# Patient Record
Sex: Female | Born: 1985 | Race: White | Hispanic: No | Marital: Married | Smoking: Current every day smoker
Health system: Southern US, Community
[De-identification: ages and names within clinical notes are randomized; demographics above are authoritative.]

## PROBLEM LIST (undated history)

## (undated) DIAGNOSIS — F419 Anxiety disorder, unspecified: Secondary | ICD-10-CM

## (undated) DIAGNOSIS — K37 Unspecified appendicitis: Secondary | ICD-10-CM

## (undated) DIAGNOSIS — Z789 Other specified health status: Secondary | ICD-10-CM

## (undated) DIAGNOSIS — K589 Irritable bowel syndrome without diarrhea: Secondary | ICD-10-CM

## (undated) HISTORY — PX: TYMPANOPLASTY: SHX33

## (undated) HISTORY — PX: FOOT SURGERY: SHX648

## (undated) HISTORY — PX: TUBAL LIGATION: SHX77

---
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
## 2003-01-07 ENCOUNTER — Inpatient Hospital Stay (HOSPITAL_COMMUNITY): Admission: AD | Admit: 2003-01-07 | Discharge: 2003-01-07 | Payer: Self-pay | Admitting: Obstetrics

## 2003-01-24 ENCOUNTER — Inpatient Hospital Stay (HOSPITAL_COMMUNITY): Admission: AD | Admit: 2003-01-24 | Discharge: 2003-01-24 | Payer: Self-pay | Admitting: Obstetrics

## 2003-01-30 ENCOUNTER — Inpatient Hospital Stay (HOSPITAL_COMMUNITY): Admission: AD | Admit: 2003-01-30 | Discharge: 2003-01-30 | Payer: Self-pay | Admitting: Obstetrics

## 2003-02-06 ENCOUNTER — Inpatient Hospital Stay (HOSPITAL_COMMUNITY): Admission: AD | Admit: 2003-02-06 | Discharge: 2003-02-09 | Payer: Self-pay | Admitting: Obstetrics

## 2004-01-25 ENCOUNTER — Emergency Department (HOSPITAL_COMMUNITY): Admission: EM | Admit: 2004-01-25 | Discharge: 2004-01-26 | Payer: Self-pay | Admitting: Emergency Medicine

## 2004-07-15 ENCOUNTER — Emergency Department (HOSPITAL_COMMUNITY): Admission: EM | Admit: 2004-07-15 | Discharge: 2004-07-15 | Payer: Self-pay | Admitting: Emergency Medicine

## 2004-08-29 ENCOUNTER — Emergency Department (HOSPITAL_COMMUNITY): Admission: EM | Admit: 2004-08-29 | Discharge: 2004-08-29 | Payer: Self-pay | Admitting: Emergency Medicine

## 2004-08-30 ENCOUNTER — Emergency Department (HOSPITAL_COMMUNITY): Admission: EM | Admit: 2004-08-30 | Discharge: 2004-08-30 | Payer: Self-pay | Admitting: Family Medicine

## 2005-01-26 ENCOUNTER — Inpatient Hospital Stay (HOSPITAL_COMMUNITY): Admission: AD | Admit: 2005-01-26 | Discharge: 2005-01-26 | Payer: Self-pay | Admitting: Obstetrics

## 2005-05-21 ENCOUNTER — Emergency Department (HOSPITAL_COMMUNITY): Admission: AD | Admit: 2005-05-21 | Discharge: 2005-05-21 | Payer: Self-pay | Admitting: Emergency Medicine

## 2005-08-20 ENCOUNTER — Emergency Department (HOSPITAL_COMMUNITY): Admission: EM | Admit: 2005-08-20 | Discharge: 2005-08-20 | Payer: Self-pay | Admitting: Family Medicine

## 2005-10-08 ENCOUNTER — Emergency Department (HOSPITAL_COMMUNITY): Admission: EM | Admit: 2005-10-08 | Discharge: 2005-10-08 | Payer: Self-pay | Admitting: Family Medicine

## 2005-10-19 ENCOUNTER — Inpatient Hospital Stay (HOSPITAL_COMMUNITY): Admission: AD | Admit: 2005-10-19 | Discharge: 2005-10-19 | Payer: Self-pay | Admitting: Obstetrics

## 2005-10-20 ENCOUNTER — Ambulatory Visit (HOSPITAL_COMMUNITY): Admission: RE | Admit: 2005-10-20 | Discharge: 2005-10-20 | Payer: Self-pay | Admitting: Obstetrics

## 2005-10-28 ENCOUNTER — Emergency Department (HOSPITAL_COMMUNITY): Admission: EM | Admit: 2005-10-28 | Discharge: 2005-10-28 | Payer: Self-pay | Admitting: Family Medicine

## 2006-01-11 ENCOUNTER — Inpatient Hospital Stay (HOSPITAL_COMMUNITY): Admission: AD | Admit: 2006-01-11 | Discharge: 2006-01-12 | Payer: Self-pay | Admitting: Obstetrics

## 2006-03-06 ENCOUNTER — Encounter (INDEPENDENT_AMBULATORY_CARE_PROVIDER_SITE_OTHER): Payer: Self-pay | Admitting: Specialist

## 2006-03-06 ENCOUNTER — Inpatient Hospital Stay (HOSPITAL_COMMUNITY): Admission: AD | Admit: 2006-03-06 | Discharge: 2006-03-10 | Payer: Self-pay | Admitting: Obstetrics

## 2006-05-20 ENCOUNTER — Ambulatory Visit (HOSPITAL_COMMUNITY): Admission: RE | Admit: 2006-05-20 | Discharge: 2006-05-20 | Payer: Self-pay | Admitting: Obstetrics

## 2006-05-20 ENCOUNTER — Encounter (INDEPENDENT_AMBULATORY_CARE_PROVIDER_SITE_OTHER): Payer: Self-pay | Admitting: Specialist

## 2007-05-04 ENCOUNTER — Emergency Department: Payer: Self-pay | Admitting: Emergency Medicine

## 2007-09-04 ENCOUNTER — Emergency Department (HOSPITAL_COMMUNITY): Admission: EM | Admit: 2007-09-04 | Discharge: 2007-09-04 | Payer: Self-pay | Admitting: Emergency Medicine

## 2007-11-17 ENCOUNTER — Emergency Department (HOSPITAL_COMMUNITY): Admission: EM | Admit: 2007-11-17 | Discharge: 2007-11-17 | Payer: Self-pay | Admitting: Emergency Medicine

## 2008-11-23 ENCOUNTER — Emergency Department (HOSPITAL_COMMUNITY): Admission: EM | Admit: 2008-11-23 | Discharge: 2008-11-23 | Payer: Self-pay | Admitting: Emergency Medicine

## 2009-02-09 ENCOUNTER — Emergency Department (HOSPITAL_COMMUNITY): Admission: EM | Admit: 2009-02-09 | Discharge: 2009-02-09 | Payer: Self-pay | Admitting: Family Medicine

## 2009-03-23 ENCOUNTER — Emergency Department (HOSPITAL_COMMUNITY): Admission: EM | Admit: 2009-03-23 | Discharge: 2009-03-23 | Payer: Self-pay | Admitting: Family Medicine

## 2009-07-24 ENCOUNTER — Emergency Department (HOSPITAL_COMMUNITY): Admission: EM | Admit: 2009-07-24 | Discharge: 2009-07-24 | Payer: Self-pay | Admitting: Family Medicine

## 2009-11-23 ENCOUNTER — Emergency Department (HOSPITAL_COMMUNITY): Admission: EM | Admit: 2009-11-23 | Discharge: 2009-11-24 | Payer: Self-pay | Admitting: Emergency Medicine

## 2010-03-10 ENCOUNTER — Emergency Department (HOSPITAL_COMMUNITY): Admission: EM | Admit: 2010-03-10 | Discharge: 2010-03-11 | Payer: Self-pay | Admitting: Emergency Medicine

## 2010-06-10 ENCOUNTER — Emergency Department (HOSPITAL_COMMUNITY): Admission: EM | Admit: 2010-06-10 | Discharge: 2010-06-11 | Payer: Self-pay | Admitting: Emergency Medicine

## 2011-01-24 ENCOUNTER — Inpatient Hospital Stay (INDEPENDENT_AMBULATORY_CARE_PROVIDER_SITE_OTHER)
Admission: RE | Admit: 2011-01-24 | Discharge: 2011-01-24 | Disposition: A | Discharge status: Home / Self Care | Payer: Self-pay | Source: Ambulatory Visit | Attending: Family Medicine | Admitting: Family Medicine

## 2011-01-24 DIAGNOSIS — N390 Urinary tract infection, site not specified: Secondary | ICD-10-CM

## 2011-01-24 DIAGNOSIS — R229 Localized swelling, mass and lump, unspecified: Secondary | ICD-10-CM

## 2011-01-24 LAB — POCT URINALYSIS DIPSTICK
Glucose, UA: NEGATIVE mg/dL
Ketones, ur: NEGATIVE mg/dL
Nitrite: POSITIVE — AB
Protein, ur: 30 mg/dL — AB
Specific Gravity, Urine: 1.015 (ref 1.005–1.030)
Urobilinogen, UA: 0.2 mg/dL (ref 0.0–1.0)

## 2011-01-26 LAB — URINE CULTURE: Colony Count: >=100000

## 2011-01-31 ENCOUNTER — Inpatient Hospital Stay (INDEPENDENT_AMBULATORY_CARE_PROVIDER_SITE_OTHER)
Admission: RE | Admit: 2011-01-31 | Discharge: 2011-01-31 | Disposition: A | Discharge status: Home / Self Care | Payer: Self-pay | Source: Ambulatory Visit | Attending: Emergency Medicine | Admitting: Emergency Medicine

## 2011-01-31 DIAGNOSIS — K047 Periapical abscess without sinus: Secondary | ICD-10-CM

## 2011-01-31 LAB — POCT PREGNANCY, URINE: Preg Test, Ur: NEGATIVE

## 2011-02-07 LAB — D-DIMER, QUANTITATIVE: D-Dimer, Quant: <0.22 ug/mL-FEU (ref 0.00–0.48)

## 2011-02-09 LAB — URINALYSIS, ROUTINE W REFLEX MICROSCOPIC
Bilirubin Urine: NEGATIVE
Glucose, UA: NEGATIVE mg/dL
Hgb urine dipstick: NEGATIVE
Ketones, ur: NEGATIVE mg/dL
Nitrite: NEGATIVE
Protein, ur: NEGATIVE mg/dL
Specific Gravity, Urine: 1.005 (ref 1.005–1.030)
Urobilinogen, UA: 0.2 mg/dL (ref 0.0–1.0)
pH: 6 (ref 5.0–8.0)

## 2011-02-09 LAB — POCT PREGNANCY, URINE: Preg Test, Ur: NEGATIVE

## 2011-02-26 LAB — POCT I-STAT, CHEM 8
BUN: <3 mg/dL — ABNORMAL LOW (ref 6–23)
Calcium, Ion: 1.18 mmol/L (ref 1.12–1.32)
Chloride: 104 mEq/L (ref 96–112)
Creatinine, Ser: 0.7 mg/dL (ref 0.4–1.2)
Glucose, Bld: 89 mg/dL (ref 70–99)
HCT: 44 % (ref 36.0–46.0)
Hemoglobin: 15 g/dL (ref 12.0–15.0)
Potassium: 3.8 mEq/L (ref 3.5–5.1)
Sodium: 139 mEq/L (ref 135–145)
TCO2: 25 mmol/L (ref 0–100)

## 2011-02-26 LAB — POCT PREGNANCY, URINE: Preg Test, Ur: NEGATIVE

## 2011-03-08 LAB — URINALYSIS, ROUTINE W REFLEX MICROSCOPIC
Bilirubin Urine: NEGATIVE
Glucose, UA: NEGATIVE mg/dL
Hgb urine dipstick: NEGATIVE
Ketones, ur: NEGATIVE mg/dL
Nitrite: NEGATIVE
Protein, ur: NEGATIVE mg/dL
Specific Gravity, Urine: 1.024 (ref 1.005–1.030)
Urobilinogen, UA: 1 mg/dL (ref 0.0–1.0)
pH: 6.5 (ref 5.0–8.0)

## 2011-03-08 LAB — POCT PREGNANCY, URINE: Preg Test, Ur: NEGATIVE

## 2011-04-09 NOTE — Discharge Summary (Signed)
   NAME:  Cindy Livingston, Cindy Livingston                     ACCOUNT NO.:  [id number]   MEDICAL RECORD NO.:  [id number]                   PATIENT TYPE:  INP   LOCATION:  9144                                 FACILITY:  [facility]   PHYSICIAN:  Kathreen Cosier, M.D.           DATE OF BIRTH:  08/31/1986   DATE OF ADMISSION:  02/06/2003  DATE OF DISCHARGE:  02/09/2003                                 DISCHARGE SUMMARY   HOSPITAL COURSE:  The patient is an 25 year old primigravida at term who had  had a lesion that appeared to herpes the week before admission.  The  cultures were not received and not available at the time the patient was  seen in labor, so it was decided she would be delivered by C section because  of possible herpes.  On admission her hemoglobin was 11.9, platelets 249.  Postoperatively, her hemoglobin was 10.7, platelets 210.  The urine was  negative.  The patient underwent primary low transverse cesarean section and  did well postoperatively.   DISPOSITION:  She was discharged home on postoperative day #3 on Tylox one  p.o. q.3-4h for pain and ferrous sulfate 325 p.o. daily.                                               Kathreen Cosier, M.D.    BAM/MEDQ  D:  02/09/2003  T:  02/09/2003  Job:  [id number]

## 2011-04-09 NOTE — H&P (Signed)
   NAME:  Cindy Livingston, Cindy Livingston                     ACCOUNT NO.:  [id number]   MEDICAL RECORD NO.:  [id number]                   PATIENT TYPE:  INP   LOCATION:  9144                                 [facility]   PHYSICIAN:  Kathreen Cosier, M.D.           DATE OF BIRTH:  06/07/1986   DATE OF ADMISSION:  02/06/2003  DATE OF DISCHARGE:                                HISTORY & PHYSICAL   HISTORY OF PRESENT ILLNESS:  The patient is an 25 year old primigravida, [facility]  February 09, 2003.  The week prior to coming in in labor she had a lesion on  her vulva which was cultured for herpes.  It had the appearance of herpes;  however, the results were not available when the patient came in on February 06, 2003 and she was 3 cm, 100%, vertex, 0 station.  It was decided she  would be delivered by C section because of the possibility of herpes.  GBS  was negative.   PHYSICAL EXAMINATION:  HEENT:  Negative.  LUNGS:  Clear.  HEART:  Regular rhythm, no murmurs or gallop.  ABDOMEN:  Term-sized uterus.  Fetal heart 140.  PELVIC:  As described above.  EXTREMITIES:  Negative.                                               Kathreen Cosier, M.D.    BAM/MEDQ  D:  02/09/2003  T:  02/09/2003  Job:  [id number]

## 2011-04-09 NOTE — Op Note (Signed)
   NAME:  Cindy Livingston, Cindy Livingston                     ACCOUNT NO.:  [id number]   MEDICAL RECORD NO.:  [id number]                   PATIENT TYPE:  INP   LOCATION:  9144                                 [facility]   PHYSICIAN:  Kathreen Cosier, M.D.           DATE OF BIRTH:  [birth year]   DATE OF PROCEDURE:  02/06/2003  DATE OF DISCHARGE:  02/09/2003                                 OPERATIVE REPORT   PREOPERATIVE DIAGNOSIS:  Possible herpes.   PROCEDURE:   SURGEON:  Kathreen Cosier, M.D.   ANESTHESIA:  Spinal.   DESCRIPTION OF PROCEDURE:  The patient was placed on the operating table in  the supine position after spinal administered.  Abdomen prepped and draped,  bladder emptied with a Foley catheter.  Transverse suprapubic incision made  and carried down to the rectus fascia.  The fascia was __________ the length  of the incision.  The recti muscles were retracted laterally.  The  peritoneum was __________.  A transverse incision was made in the visceral  peritoneum above the bladder, and the bladder mobilized inferiorly.  A  transverse lower uterine incision was made.  The patient delivered from the  LOA position of a female, Apgars 8 and 9, weighing 6 pounds 4 ounces.  The  placenta was anterior, removed manually.  Uterine cavity cleaned with dry  laps.  The uterine incision was closed in one layer with continuous suture  of #1 chromic.  The uterus was well contracted.  Tubes and ovaries normal.  Abdomen closed in layers, peritoneum continuous with 2-0 chromic, fascia  with continuous 2-0 Dexon, and the skin closed with subcuticular stitch of 3-  0 plain.  Blood loss was 300 mL.                                               Kathreen Cosier, M.D.    BAM/MEDQ  D:  02/09/2003  T:  02/09/2003  Job:  [id number]

## 2011-04-09 NOTE — Discharge Summary (Signed)
NAMELEI, DOWER           ACCOUNT NO.:  [id number]   MEDICAL RECORD NO.:  [id number]          PATIENT TYPE:  INP   LOCATION:  9148                          [facility]   PHYSICIAN:  Kathreen Cosier, M.D.DATE OF BIRTH:  [birth year]   DATE OF ADMISSION:  03/06/2006  DATE OF DISCHARGE:  03/10/2006                                 DISCHARGE SUMMARY   The patient is a 25 year old, gravida 2, para 1-0-1, who had a previous C-  section.  She was now pregnant with twins.  Her EDC was Apr 07, 2006.  She  was followed with nonstress test and biphasic profile.  She was admitted in  early labor and underwent repeat low transverse cesarean section to an angle  of the vertex.  Apgar 8 and 9, weighing 4 pounds 6 ounces; twin B weighed 3  pounds 14 ounces, Apgar 8 and 9.   On admission, her hemoglobin was 10.2.  Postoperative hemoglobin 9.6.  She  did well and was discharged home on the third postoperative day, ambulatory  and on a regular diet, to see me in six weeks.   DISCHARGE DIAGNOSIS:  Status post repeat low transverse cesarean section at  term with twin gestation.           ______________________________  Kathreen Cosier, M.D.     BAM/MEDQ  D:  03/30/2006  T:  03/31/2006  Job:  [id number]

## 2011-04-09 NOTE — Op Note (Signed)
NAMERAGHAD, LORENZ           ACCOUNT NO.:  [id number]   MEDICAL RECORD NO.:  [id number]          PATIENT TYPE:  INP   LOCATION:  9148                          [facility]   PHYSICIAN:  Kathreen Cosier, M.D.DATE OF BIRTH:  May 27, 1986   DATE OF PROCEDURE:  03/06/2006  DATE OF DISCHARGE:                                 OPERATIVE REPORT   PREOPERATIVE DIAGNOSIS:  Previous cesarean section at 25+ weeks, pregnant  with twins in early labor, desires repeat.   SURGEON:  Kathreen Cosier, M.D.   FIRST ASSISTANT:  Charles A. Clearance Coots, M.D.   ANESTHESIA:  Spinal.   PROCEDURE:  The patient is on the operating table in a supine position.  After the spinal was administered, the abdomen was prepped and draped.  The  bladder was emptied with a Foley catheter.  A transverse suprapubic incision  was made through the old scar and carried down to the rectus fascia.  The  fascia wasopen to the length of the incision.  The rectus muscles were  retracted laterally.  The peritoneum was entered longitudinally.  A  transverse incision was made in the visceral peritoneum above the bladder.  The bladder was mobilized inferior.  A transverse lower uterine incision was  made.  The fluid around twin A was clear.  It was a vertex female with  Apgars 8 and 9, being 4 pounds 6 ounces.  Twin B was also vertex and fluid  was clear and a little bit from the LOA position.  The baby was a female  weighing 3 pounds 14 ounces with Apgars of 8 and 9.  Placenta was posterior  and removed manually.  The uterine cavity was cleaned with dry laps.  The  uterine incision was closed in one layer with continuous suture of #1  chromic.  Hemostasis was satisfactory.  The bladder flap was closed with 2-0  chromic.  It was noted on placing the first suture of 2-0 to close the  bladder flap, a hematoma started developing and this was controlled by  placing two sutures of #1 chromic through the area.  There was no  further  accumulation of blood noted.  The uterus was well-contracted.  Tubes and  ovaries were normal.  The abdomen was closed at the peritoneum with  continuous 0- chromic, the fascia with continuous 2-0 Dexon.  The skin was  closed with subcuticular stitch of 4-0 Monocryl.  The patient tolerated the  procedure well.   BLOOD LOSS:  700 mL.           ______________________________  Kathreen Cosier, M.D.     BAM/MEDQ  D:  03/06/2006  T:  03/07/2006  Job:  [id number]

## 2011-04-09 NOTE — Op Note (Signed)
Cindy Livingston, Cindy Livingston           ACCOUNT NO.:  [id number]   MEDICAL RECORD NO.:  [id number]          PATIENT TYPE:  AMB   LOCATION:  SDC                           [facility]   PHYSICIAN:  Kathreen Cosier, M.D.DATE OF BIRTH:  05-11-1986   DATE OF PROCEDURE:  05/20/2006  DATE OF DISCHARGE:                                 OPERATIVE REPORT   PREOPERATIVE DIAGNOSIS:  Severe dysplasia of the cervix.   OPERATION PERFORMED:  Cold knife conization of the cervix under general  anesthesia.   SURGEON:  Kathreen Cosier, M.D.   DESCRIPTION OF PROCEDURE:  Patient in lithotomy position, perineum and  vagina prepped and draped.  Bladder emptied with straight catheter.  Uterus  normal size, negative adnexa.  Weighted speculum placed in vagina.  Cervix  grasped with tenaculum, injected with 8 mL of 1% Xylocaine.  Suture of #1  chromic was placed at the 3 o'clock and 9 o'clock position on the cervix for  hemostasis.  Cold knife cone done in the usual manner.  Sutures of 0 chromic  placed around the cervix for hemostasis.  The cavity was sounded and the  cervix noted to be open.  The patient tolerated the procedure well.  Taken  to recovery room in good condition.           ______________________________  Kathreen Cosier, M.D.     BAM/MEDQ  D:  05/20/2006  T:  05/20/2006  Job:  [id number]

## 2011-07-13 ENCOUNTER — Inpatient Hospital Stay (INDEPENDENT_AMBULATORY_CARE_PROVIDER_SITE_OTHER)
Admission: RE | Admit: 2011-07-13 | Discharge: 2011-07-13 | Disposition: A | Discharge status: Home / Self Care | Payer: Self-pay | Source: Ambulatory Visit | Attending: Family Medicine | Admitting: Family Medicine

## 2011-07-13 DIAGNOSIS — F411 Generalized anxiety disorder: Secondary | ICD-10-CM

## 2011-07-13 DIAGNOSIS — R002 Palpitations: Secondary | ICD-10-CM

## 2011-07-13 LAB — POCT I-STAT, CHEM 8
BUN: 8 mg/dL (ref 6–23)
Calcium, Ion: 1.18 mmol/L (ref 1.12–1.32)
Chloride: 104 mEq/L (ref 96–112)
Creatinine, Ser: 0.7 mg/dL (ref 0.50–1.10)
Glucose, Bld: 95 mg/dL (ref 70–99)
HCT: 43 % (ref 36.0–46.0)
Hemoglobin: 14.6 g/dL (ref 12.0–15.0)
Potassium: 3.9 mEq/L (ref 3.5–5.1)
TCO2: 22 mmol/L (ref 0–100)

## 2011-07-13 LAB — POCT PREGNANCY, URINE: Preg Test, Ur: NEGATIVE

## 2011-08-27 LAB — URINALYSIS, ROUTINE W REFLEX MICROSCOPIC
Bilirubin Urine: NEGATIVE
Glucose, UA: NEGATIVE
Ketones, ur: NEGATIVE
Leukocytes, UA: NEGATIVE
Nitrite: NEGATIVE
Protein, ur: NEGATIVE
Specific Gravity, Urine: 1.013
Urobilinogen, UA: 1
pH: 6

## 2011-08-27 LAB — POCT I-STAT CREATININE
Creatinine, Ser: 0.8
Operator id: 284141

## 2011-08-27 LAB — I-STAT 8, (EC8 V) (CONVERTED LAB)
Acid-base deficit: 4 — ABNORMAL HIGH
BUN: 6
Bicarbonate: 20.3
Chloride: 105
Glucose, Bld: 128 — ABNORMAL HIGH
HCT: 43
Hemoglobin: 14.6
Operator id: 284141
Potassium: 3.5
Sodium: 136
TCO2: 21
pCO2, Ven: 35.7 — ABNORMAL LOW
pH, Ven: 7.362 — ABNORMAL HIGH

## 2011-08-27 LAB — POCT CARDIAC MARKERS
CKMB, poc: <1 — ABNORMAL LOW
Myoglobin, poc: 44.6
Operator id: 284141
Troponin i, poc: <0.05

## 2011-08-27 LAB — URINE MICROSCOPIC-ADD ON

## 2011-08-27 LAB — POCT PREGNANCY, URINE
Operator id: 284141
Preg Test, Ur: NEGATIVE

## 2012-01-19 ENCOUNTER — Encounter (HOSPITAL_COMMUNITY): Payer: Self-pay | Admitting: *Deleted

## 2012-01-19 ENCOUNTER — Emergency Department (HOSPITAL_COMMUNITY)
Admission: EM | Admit: 2012-01-19 | Discharge: 2012-01-19 | Disposition: A | Discharge status: Home / Self Care | Payer: Self-pay | Attending: Emergency Medicine | Admitting: Emergency Medicine

## 2012-01-19 DIAGNOSIS — H9209 Otalgia, unspecified ear: Secondary | ICD-10-CM | POA: Insufficient documentation

## 2012-01-19 DIAGNOSIS — H6090 Unspecified otitis externa, unspecified ear: Secondary | ICD-10-CM

## 2012-01-19 DIAGNOSIS — H60399 Other infective otitis externa, unspecified ear: Secondary | ICD-10-CM | POA: Insufficient documentation

## 2012-01-19 MED ORDER — CIPROFLOXACIN-HYDROCORTISONE 0.2-1 % OT SUSP: 3.0000 [drp] | Freq: Two times a day (BID) | OTIC | Status: DC

## 2012-01-19 NOTE — Discharge Instructions (Signed)
Use Cipro otic drops as directed. Use warm compresses to ear. Avoid submersing head in water for two weeks. Follow up with [facility] next week for recheck of ongoing symptoms or recurrent symptoms. Return to ER for emergent changing or worsening of symptoms. Alternate between tylenol and ibuprofen for pain.   Otitis Externa Otitis externa ("swimmer's ear") is a germ (bacterial) or fungal infection of the outer ear canal (from the eardrum to the outside of the ear). Swimming in dirty water may cause swimmer's ear. It also may be caused by moisture in the ear from water remaining after swimming or bathing. Often the first signs of infection may be itching in the ear canal. This may progress to ear canal swelling, redness, and pus drainage, which may be signs of infection. HOME CARE INSTRUCTIONS   Apply the antibiotic drops to the ear canal as prescribed by your doctor.   This can be a very painful medical condition. A strong pain reliever may be prescribed.   Only take over-the-counter or prescription medicines for pain, discomfort, or fever as directed by your caregiver.   If your caregiver has given you a follow-up appointment, it is very important to keep that appointment. Not keeping the appointment could result in a chronic or permanent injury, pain, hearing loss and disability. If there is any problem keeping the appointment, you must call back to this facility for assistance.  PREVENTION   It is important to keep your ear dry. Use the corner of a towel to wick water out of the ear canal after swimming or bathing.   Avoid scratching in your ear. This can damage the ear canal or remove the protective wax lining the canal and make it easier for germs (bacteria) or a fungus to grow.   You may use ear drops made of rubbing alcohol and vinegar after swimming to prevent future "swimmer's ear" infections. Make up a small bottle of equal parts white vinegar and alcohol. Put 3 or 4 drops into each  ear after swimming.   Avoid swimming in lakes, polluted water, or poorly chlorinated pools.  SEEK MEDICAL CARE IF:   An oral temperature above 102 F (38.9 C) develops.   Your ear is still painful after 3 days and shows signs of getting worse (redness, swelling, pain, or pus).  MAKE SURE YOU:   Understand these instructions.   Will watch your condition.   Will get help right away if you are not doing well or get worse.  Document Released: 11/08/2005 Document Revised: 07/21/2011 Document Reviewed: 06/14/2008 [facility] Patient Information 2012 [place], [place].

## 2012-01-19 NOTE — ED Provider Notes (Signed)
History     CSN: [id number]  Arrival date & time 01/19/12  1316   First MD Initiated Contact with Patient 01/19/12 1508      Chief Complaint  Patient presents with  . Otalgia    (Consider location/radiation/quality/duration/timing/severity/associated sxs/prior treatment) HPI  Patient presents to emergency department complaining of a few day history of gradual onset left ear pain with a 2 day history of drainage from her left ear. Patient states she has a history of recurring outer ear infections being seen by [facility]NT multiple times during her life time due to recurrent ear infections. Patient states her last known ear infection was approximately 2 years ago and she states "it always heals up quickly after I start Cipro eardrops." Patient brings her old medication bottle of the Cipro eardrops to show provider. Patient states she's been taking over-the-counter Tylenol and ibuprofen for the pain with good relief of symptoms. Patient states the drainage from her ear is white and purulent. She denies fevers, chills, headache, sore throat, erythema or edema of outer ear, dizziness, or confusion. Patient states symptoms are similar to her history of recurrent ear infections.  History reviewed. No pertinent past medical history.  History reviewed. No pertinent past surgical history.  History reviewed. No pertinent family history.  History  Substance Use Topics  . Smoking status: Current Everyday Smoker    Types: Cigarettes  . Smokeless tobacco: Not on file  . Alcohol Use: Yes     occ    OB History    Grav Para Term Preterm Abortions TAB SAB Ect Mult Living                  Review of Systems  All other systems reviewed and are negative.    Allergies  Codeine  Home Medications   Current Outpatient Rx  Name Route Sig Dispense Refill  . CIPROFLOXACIN-HYDROCORTISONE 0.2-1 % OT SUSP Left Ear Place 3 drops into the left ear 2 (two) times daily. 10 mL 0    BP 111/74   Pulse 100  Temp(Src) 98 F (36.7 C) (Oral)  Resp 16  SpO2 99%  LMP 01/17/2012  Physical Exam  Vitals reviewed. Constitutional: She is oriented to person, place, and time. She appears well-developed and well-nourished. No distress.  HENT:  Head: Normocephalic and atraumatic.  Right Ear: External ear normal.  Left Ear: External ear normal.  Nose: Nose normal.  Mouth/Throat: No oropharyngeal exudate.       Mild erythema of posterior pharynx and tonsils no tonsillar exudate or enlargement. Patent airway. Swallowing secretions well  Erythema of out her ear canal with moderate amount of purulent drainage. TM is clear. No erythema or edema of left outer ear. No tenderness to palpation of tragus. No post auricular tenderness, redness, or edema  Normal right ear, externa and TM  Eyes: Conjunctivae are normal.  Neck: Normal range of motion. Neck supple.  Cardiovascular: Normal rate, regular rhythm and normal heart sounds.  Exam reveals no gallop and no friction rub.   No murmur heard. Pulmonary/Chest: Effort normal and breath sounds normal. No respiratory distress. She has no wheezes. She has no rales. She exhibits no tenderness.  Abdominal: Soft. She exhibits no distension and no mass. There is no tenderness. There is no rebound and no guarding.  Lymphadenopathy:    She has no cervical adenopathy.  Neurological: She is alert and oriented to person, place, and time. She has normal reflexes.  Skin: Skin is warm and dry. No  rash noted. She is not diaphoretic.  Psychiatric: She has a normal mood and affect.    ED Course  Procedures (including critical care time)  Labs Reviewed - No data to display No results found.   1. Otitis externa       MDM  Otitis externa without signs or symptoms of malignant otitis externa. Afebrile. Non toxic appearing.         [place], [place] 01/19/12 1526

## 2012-01-19 NOTE — ED Notes (Signed)
Reports having pain and drainage to left ear for several days, reports hx of frequent ear infections.

## 2012-01-20 NOTE — ED Provider Notes (Signed)
Medical screening examination/treatment/procedure(s) were performed by non-physician practitioner and as supervising physician I was immediately available for consultation/collaboration.  Jadene Stemmer, MD 01/20/12 0225 

## 2012-03-20 ENCOUNTER — Encounter (HOSPITAL_COMMUNITY): Payer: Self-pay | Admitting: Anesthesiology

## 2012-03-20 ENCOUNTER — Encounter (HOSPITAL_COMMUNITY)
Admission: EM | Disposition: A | Discharge status: Home / Self Care | Payer: Self-pay | Source: Home / Self Care | Attending: Emergency Medicine

## 2012-03-20 ENCOUNTER — Emergency Department (HOSPITAL_COMMUNITY): Payer: Self-pay | Admitting: Anesthesiology

## 2012-03-20 ENCOUNTER — Emergency Department (HOSPITAL_COMMUNITY): Payer: Self-pay

## 2012-03-20 ENCOUNTER — Observation Stay (HOSPITAL_COMMUNITY)
Admission: EM | Admit: 2012-03-20 | Discharge: 2012-03-22 | Disposition: A | Discharge status: Home / Self Care | Payer: Self-pay | Attending: General Surgery | Admitting: General Surgery

## 2012-03-20 ENCOUNTER — Encounter (HOSPITAL_COMMUNITY): Payer: Self-pay | Admitting: Emergency Medicine

## 2012-03-20 DIAGNOSIS — K3580 Unspecified acute appendicitis: Principal | ICD-10-CM | POA: Insufficient documentation

## 2012-03-20 DIAGNOSIS — K389 Disease of appendix, unspecified: Secondary | ICD-10-CM

## 2012-03-20 HISTORY — PX: LAPAROSCOPIC APPENDECTOMY: SHX408

## 2012-03-20 HISTORY — DX: Unspecified appendicitis: K37

## 2012-03-20 HISTORY — DX: Other specified health status: Z78.9

## 2012-03-20 HISTORY — PX: APPENDECTOMY: SHX54

## 2012-03-20 LAB — CBC
HCT: 42.3 % (ref 36.0–46.0)
Hemoglobin: 14.1 g/dL (ref 12.0–15.0)
MCH: 26.3 pg (ref 26.0–34.0)
MCHC: 33.3 g/dL (ref 30.0–36.0)

## 2012-03-20 LAB — URINALYSIS, ROUTINE W REFLEX MICROSCOPIC
Nitrite: NEGATIVE
Specific Gravity, Urine: 1.005 (ref 1.005–1.030)
Urobilinogen, UA: 1 mg/dL (ref 0.0–1.0)

## 2012-03-20 LAB — COMPREHENSIVE METABOLIC PANEL
BUN: 5 mg/dL — ABNORMAL LOW (ref 6–23)
Calcium: 9.1 mg/dL (ref 8.4–10.5)
GFR calc Af Amer: >90 mL/min (ref >90)
Glucose, Bld: 103 mg/dL — ABNORMAL HIGH (ref 70–99)
Sodium: 136 mEq/L (ref 135–145)
Total Protein: 7.2 g/dL (ref 6.0–8.3)

## 2012-03-20 LAB — PREGNANCY, URINE: Preg Test, Ur: NEGATIVE

## 2012-03-20 LAB — LIPASE, BLOOD: Lipase: 23 U/L (ref 11–59)

## 2012-03-20 SURGERY — APPENDECTOMY, LAPAROSCOPIC
Anesthesia: General | Wound class: Clean

## 2012-03-20 MED ORDER — IOHEXOL 300 MG/ML  SOLN
20.0000 mL | INTRAMUSCULAR | Status: DC
  Administered 2012-03-20: 20 mL via ORAL

## 2012-03-20 MED ORDER — LACTATED RINGERS IV SOLN
INTRAVENOUS | Status: DC | PRN
  Administered 2012-03-20 (×2): via INTRAVENOUS

## 2012-03-20 MED ORDER — FENTANYL CITRATE 0.05 MG/ML IJ SOLN
INTRAMUSCULAR | Status: DC | PRN
  Administered 2012-03-20: 150 ug via INTRAVENOUS

## 2012-03-20 MED ORDER — LIDOCAINE-EPINEPHRINE 1 %-1:100000 IJ SOLN
INTRAMUSCULAR | Status: DC | PRN
  Administered 2012-03-20: 20 mL

## 2012-03-20 MED ORDER — ONDANSETRON HCL 4 MG/2ML IJ SOLN
INTRAMUSCULAR | Status: DC | PRN
  Administered 2012-03-20: 4 mg via INTRAVENOUS

## 2012-03-20 MED ORDER — NEOSTIGMINE METHYLSULFATE 1 MG/ML IJ SOLN
INTRAMUSCULAR | Status: DC | PRN
  Administered 2012-03-20: 3 mg via INTRAVENOUS

## 2012-03-20 MED ORDER — ENOXAPARIN SODIUM 40 MG/0.4ML ~~LOC~~ SOLN
40.0000 mg | SUBCUTANEOUS | Status: DC
  Administered 2012-03-21 – 2012-03-22 (×2): 40 mg via SUBCUTANEOUS
  Filled 2012-03-20 (×4): qty 0.4

## 2012-03-20 MED ORDER — ONDANSETRON HCL 4 MG/2ML IJ SOLN
4.0000 mg | Freq: Once | INTRAMUSCULAR | Status: AC
  Administered 2012-03-20: 4 mg via INTRAVENOUS
  Filled 2012-03-20: qty 2

## 2012-03-20 MED ORDER — SODIUM CHLORIDE 0.9 % IV SOLN
1.0000 g | Freq: Once | INTRAVENOUS | Status: AC
  Administered 2012-03-20: 1 g via INTRAVENOUS
  Filled 2012-03-20: qty 1

## 2012-03-20 MED ORDER — ROCURONIUM BROMIDE 100 MG/10ML IV SOLN
INTRAVENOUS | Status: DC | PRN
  Administered 2012-03-20: 30 mg via INTRAVENOUS

## 2012-03-20 MED ORDER — ONDANSETRON HCL 4 MG/2ML IJ SOLN
4.0000 mg | Freq: Four times a day (QID) | INTRAMUSCULAR | Status: DC | PRN
  Administered 2012-03-20 – 2012-03-22 (×2): 4 mg via INTRAVENOUS
  Filled 2012-03-20 (×2): qty 2

## 2012-03-20 MED ORDER — SUCCINYLCHOLINE CHLORIDE 20 MG/ML IJ SOLN
INTRAMUSCULAR | Status: DC | PRN
  Administered 2012-03-20: 100 mg via INTRAVENOUS

## 2012-03-20 MED ORDER — ONDANSETRON HCL 4 MG/2ML IJ SOLN
INTRAMUSCULAR | Status: AC
  Administered 2012-03-20: 4 mg via INTRAVENOUS
  Filled 2012-03-20: qty 2

## 2012-03-20 MED ORDER — FENTANYL CITRATE 0.05 MG/ML IJ SOLN
25.0000 ug | INTRAMUSCULAR | Status: DC | PRN
  Administered 2012-03-20 (×2): 25 ug via INTRAVENOUS
  Filled 2012-03-20 (×2): qty 2

## 2012-03-20 MED ORDER — BUPIVACAINE HCL (PF) 0.25 % IJ SOLN
INTRAMUSCULAR | Status: DC | PRN
  Administered 2012-03-20: 20 mL

## 2012-03-20 MED ORDER — KCL IN DEXTROSE-NACL 20-5-0.45 MEQ/L-%-% IV SOLN
INTRAVENOUS | Status: DC
Start: 2012-03-20 — End: 2012-03-22
  Administered 2012-03-20 – 2012-03-21 (×2): via INTRAVENOUS
  Filled 2012-03-20 (×8): qty 1000

## 2012-03-20 MED ORDER — HYDROMORPHONE HCL PF 1 MG/ML IJ SOLN
INTRAMUSCULAR | Status: AC
  Filled 2012-03-20: qty 1

## 2012-03-20 MED ORDER — SODIUM CHLORIDE 0.9 % IR SOLN
Status: DC | PRN
  Administered 2012-03-20: 1

## 2012-03-20 MED ORDER — ONDANSETRON HCL 4 MG/2ML IJ SOLN
INTRAMUSCULAR | Status: AC
  Filled 2012-03-20: qty 2

## 2012-03-20 MED ORDER — PROMETHAZINE HCL 25 MG/ML IJ SOLN: 12.5000 mg | Freq: Four times a day (QID) | INTRAMUSCULAR | Status: DC | PRN

## 2012-03-20 MED ORDER — ONDANSETRON HCL 4 MG/2ML IJ SOLN
4.0000 mg | Freq: Once | INTRAMUSCULAR | Status: AC | PRN
  Administered 2012-03-20: 4 mg via INTRAVENOUS

## 2012-03-20 MED ORDER — PROPOFOL 10 MG/ML IV EMUL
INTRAVENOUS | Status: DC | PRN
  Administered 2012-03-20: 180 mg via INTRAVENOUS

## 2012-03-20 MED ORDER — SODIUM CHLORIDE 0.9 % IV SOLN
INTRAVENOUS | Status: DC
  Administered 2012-03-20: 03:00:00 via INTRAVENOUS

## 2012-03-20 MED ORDER — LACTATED RINGERS IV SOLN
INTRAVENOUS | Status: DC
  Administered 2012-03-20: 09:00:00 via INTRAVENOUS

## 2012-03-20 MED ORDER — SODIUM CHLORIDE 0.9 % IR SOLN
Status: DC | PRN
  Administered 2012-03-20: 1000 mL

## 2012-03-20 MED ORDER — HYDROMORPHONE HCL PF 1 MG/ML IJ SOLN
1.0000 mg | Freq: Once | INTRAMUSCULAR | Status: AC
  Administered 2012-03-20: 1 mg via INTRAVENOUS
  Filled 2012-03-20: qty 1

## 2012-03-20 MED ORDER — HYDROMORPHONE HCL PF 1 MG/ML IJ SOLN
0.2500 mg | INTRAMUSCULAR | Status: DC | PRN
  Administered 2012-03-20 (×2): 0.5 mg via INTRAVENOUS

## 2012-03-20 MED ORDER — GLYCOPYRROLATE 0.2 MG/ML IJ SOLN
INTRAMUSCULAR | Status: DC | PRN
  Administered 2012-03-20: .6 mg via INTRAVENOUS

## 2012-03-20 MED ORDER — MIDAZOLAM HCL 5 MG/5ML IJ SOLN
INTRAMUSCULAR | Status: DC | PRN
  Administered 2012-03-20: 2 mg via INTRAVENOUS

## 2012-03-20 MED ORDER — KETOROLAC TROMETHAMINE 30 MG/ML IJ SOLN
30.0000 mg | Freq: Four times a day (QID) | INTRAMUSCULAR | Status: DC | PRN
  Administered 2012-03-20 – 2012-03-21 (×2): 30 mg via INTRAVENOUS
  Filled 2012-03-20: qty 1

## 2012-03-20 MED ORDER — HYDROMORPHONE HCL 2 MG PO TABS
2.0000 mg | ORAL_TABLET | ORAL | Status: DC | PRN
  Administered 2012-03-20 – 2012-03-21 (×5): 2 mg via ORAL
  Filled 2012-03-20 (×6): qty 1

## 2012-03-20 MED ORDER — KETOROLAC TROMETHAMINE 30 MG/ML IJ SOLN
INTRAMUSCULAR | Status: AC
  Filled 2012-03-20: qty 1

## 2012-03-20 MED ORDER — IOHEXOL 300 MG/ML  SOLN
100.0000 mL | Freq: Once | INTRAMUSCULAR | Status: AC | PRN
  Administered 2012-03-20: 100 mL via INTRAVENOUS

## 2012-03-20 MED ORDER — MORPHINE SULFATE 2 MG/ML IJ SOLN
2.0000 mg | INTRAMUSCULAR | Status: DC | PRN
  Administered 2012-03-20 – 2012-03-22 (×2): 2 mg via INTRAVENOUS
  Filled 2012-03-20 (×2): qty 1

## 2012-03-20 SURGICAL SUPPLY — 47 items
APPLIER CLIP ROT 10 11.4 M/L (STAPLE)
CANISTER SUCTION 2500CC (MISCELLANEOUS) IMPLANT
CHLORAPREP W/TINT 26ML (MISCELLANEOUS) ×4 IMPLANT
CLIP APPLIE ROT 10 11.4 M/L (STAPLE) IMPLANT
CLOTH BEACON ORANGE TIMEOUT ST (SAFETY) ×2 IMPLANT
COVER SURGICAL LIGHT HANDLE (MISCELLANEOUS) ×2 IMPLANT
CUTTER FLEX LINEAR 45M (STAPLE) ×2 IMPLANT
DECANTER SPIKE VIAL GLASS SM (MISCELLANEOUS) IMPLANT
DERMABOND ADVANCED (GAUZE/BANDAGES/DRESSINGS) ×3
DERMABOND ADVANCED .7 DNX12 (GAUZE/BANDAGES/DRESSINGS) ×3 IMPLANT
ELECT CAUTERY BLADE 6.4 (BLADE) ×2 IMPLANT
ELECT REM PT RETURN 9FT ADLT (ELECTROSURGICAL) ×2
ELECTRODE REM PT RTRN 9FT ADLT (ELECTROSURGICAL) ×1 IMPLANT
GLOVE BIO SURGEON STRL SZ 6.5 (GLOVE) ×4 IMPLANT
GLOVE BIOGEL PI IND STRL 7.0 (GLOVE) ×1 IMPLANT
GLOVE BIOGEL PI IND STRL 7.5 (GLOVE) ×1 IMPLANT
GLOVE BIOGEL PI INDICATOR 7.0 (GLOVE) ×1
GLOVE BIOGEL PI INDICATOR 7.5 (GLOVE) ×1
GLOVE SURG SS PI 7.5 STRL IVOR (GLOVE) ×6 IMPLANT
GOWN PREVENTION PLUS XLARGE (GOWN DISPOSABLE) ×2 IMPLANT
GOWN STRL NON-REIN LRG LVL3 (GOWN DISPOSABLE) ×4 IMPLANT
HANDLE UNIV ENDO GIA (ENDOMECHANICALS) ×4 IMPLANT
KIT BASIN OR (CUSTOM PROCEDURE TRAY) ×2 IMPLANT
KIT ROOM TURNOVER OR (KITS) ×2 IMPLANT
NS IRRIG 1000ML POUR BTL (IV SOLUTION) ×2 IMPLANT
PAD ARMBOARD 7.5X6 YLW CONV (MISCELLANEOUS) ×4 IMPLANT
PENCIL BUTTON HOLSTER BLD 10FT (ELECTRODE) ×2 IMPLANT
POUCH SPECIMEN RETRIEVAL 10MM (ENDOMECHANICALS) ×2 IMPLANT
RELOAD 45 VASCULAR/THIN (ENDOMECHANICALS) IMPLANT
RELOAD EGIA 45 MED/THCK PURPLE (STAPLE) ×4 IMPLANT
RELOAD EGIA 45 TAN VASC (STAPLE) ×2 IMPLANT
RELOAD STAPLE TA45 3.5 REG BLU (ENDOMECHANICALS) IMPLANT
SCALPEL HARMONIC ACE (MISCELLANEOUS) ×2 IMPLANT
SCISSORS LAP 5X35 DISP (ENDOMECHANICALS) IMPLANT
SET IRRIG TUBING LAPAROSCOPIC (IRRIGATION / IRRIGATOR) ×2 IMPLANT
SLEEVE ENDOPATH XCEL 5M (ENDOMECHANICALS) ×2 IMPLANT
SPECIMEN JAR SMALL (MISCELLANEOUS) ×2 IMPLANT
SUT MNCRL AB 4-0 PS2 18 (SUTURE) ×4 IMPLANT
SUT VICRYL 0 UR6 27IN ABS (SUTURE) ×4 IMPLANT
TOWEL OR 17X24 6PK STRL BLUE (TOWEL DISPOSABLE) ×2 IMPLANT
TOWEL OR 17X26 10 PK STRL BLUE (TOWEL DISPOSABLE) ×2 IMPLANT
TRAY FOLEY CATH 14FR (SET/KITS/TRAYS/PACK) ×2 IMPLANT
TRAY LAPAROSCOPIC (CUSTOM PROCEDURE TRAY) ×2 IMPLANT
TROCAR BALLN 12MMX100 BLUNT (TROCAR) ×2 IMPLANT
TROCAR XCEL BLUNT TIP 100MML (ENDOMECHANICALS) IMPLANT
TROCAR XCEL NON-BLD 5MMX100MML (ENDOMECHANICALS) ×2 IMPLANT
WATER STERILE IRR 1000ML POUR (IV SOLUTION) IMPLANT

## 2012-03-20 NOTE — Anesthesia Procedure Notes (Signed)
Procedure Name: Intubation Date/Time: 03/20/2012 9:44 AM Performed by: Gwenyth Allegra Pre-anesthesia Checklist: Timeout performed, Patient identified, Emergency Drugs available and Patient being monitored Patient Re-evaluated:Patient Re-evaluated prior to inductionOxygen Delivery Method: Circle system utilized Intubation Type: IV induction, Rapid sequence and Cricoid Pressure applied Laryngoscope Size: Mac and 3 Grade View: Grade I Tube type: Oral Tube size: 7.0 mm Airway Equipment and Method: Stylet Placement Confirmation: ETT inserted through vocal cords under direct vision,  breath sounds checked- equal and bilateral and positive ETCO2 Secured at: 21 cm Tube secured with: Tape Dental Injury: Teeth and Oropharynx as per pre-operative assessment

## 2012-03-20 NOTE — Brief Op Note (Signed)
03/20/2012  11:09 AM  PATIENT:  Cindy Livingston  26 y.o. female  PRE-OPERATIVE DIAGNOSIS:  Appendicitis  POST-OPERATIVE DIAGNOSIS:  appendicitis  PROCEDURE:  Procedure(s) (LRB): APPENDECTOMY LAPAROSCOPIC (N/A)  SURGEON:  Surgeon(s) and Role:    * [name], DO - Primary  PHYSICIAN ASSISTANT:   ASSISTANTS: none   ANESTHESIA:   general  EBL:  Total I/O In: 1000 [I.V.:1000] Out: 175 [Urine:175]  BLOOD ADMINISTERED:none  DRAINS: none   LOCAL MEDICATIONS USED:  MARCAINE    and LIDOCAINE   SPECIMEN:  Source of Specimen:  appendix  DISPOSITION OF SPECIMEN:  PATHOLOGY  COUNTS:  YES  TOURNIQUET:  * No tourniquets in log *  DICTATION: .Other Dictation: Dictation Number [id number]  PLAN OF CARE: Admit for overnight observation  PATIENT DISPOSITION:  PACU - hemodynamically stable.   Delay start of Pharmacological VTE agent (>24hrs) due to surgical blood loss or risk of bleeding: no

## 2012-03-20 NOTE — ED Notes (Addendum)
Patient is resting comfortably. States no needs at this time.  

## 2012-03-20 NOTE — Progress Notes (Signed)
UR complete 

## 2012-03-20 NOTE — ED Notes (Addendum)
Pt reports abd pain, onset yesterday around1400, comes and goes, started mid upper abd around xyphoid process area, now pain more RLQ, also some nausea, (denies: bleeding, vd, fever or rectal, vaginal or urinary sx). Last ate around 1500 (3 hamburger patties), states, "seemed to be worse after eating", last BM 1700 (2 small, 2nd loose). No meds PTA, no h/o abd surgeries. Does now mention: some urinary frequency, "more than usual".

## 2012-03-20 NOTE — ED Notes (Signed)
Surgeon at bedside.  

## 2012-03-20 NOTE — ED Provider Notes (Signed)
History     CSN: [id number]  Arrival date & time 03/20/12  0201   First MD Initiated Contact with Patient 03/20/12 0216      Chief Complaint  Patient presents with  . Abdominal Pain    (Consider location/radiation/quality/duration/timing/severity/associated sxs/prior treatment) HPI Right lower quadrant abdominal pain. Started yesterday with mid abdominal pain and now has migrated to right lower quadrant. Sharp in quality has been constant and waxing and waning without radiation. Moderate in severity. No history of similar symptoms. Has had some nausea but no vomiting or diarrhea. No vaginal bleeding or discharge. No fevers. No recent travel or medications.   History reviewed. No pertinent past medical history.  History reviewed. No pertinent past surgical history.  No family history on file.  History  Substance Use Topics  . Smoking status: Current Everyday Smoker    Types: Cigarettes  . Smokeless tobacco: Not on file  . Alcohol Use: Yes     occ    OB History    Grav Para Term Preterm Abortions TAB SAB Ect Mult Living                  Review of Systems  Constitutional: Negative for fever and chills.  HENT: Negative for neck pain and neck stiffness.   Eyes: Negative for pain.  Respiratory: Negative for shortness of breath.   Cardiovascular: Negative for chest pain.  Gastrointestinal: Positive for nausea and abdominal pain.  Genitourinary: Negative for dysuria.  Musculoskeletal: Negative for back pain.  Skin: Negative for rash.  Neurological: Negative for headaches.  All other systems reviewed and are negative.    Allergies  Codeine  Home Medications  No current outpatient prescriptions on file.  BP 122/75  Pulse 122  Temp(Src) 98.2 F (36.8 C) (Oral)  Resp 17  SpO2 99%  LMP 03/14/2012  Physical Exam  Constitutional: She is oriented to person, place, and time. She appears well-developed and well-nourished.  HENT:  Head: Normocephalic and  atraumatic.  Eyes: Conjunctivae and EOM are normal. Pupils are equal, round, and reactive to light.  Neck: Trachea normal. Neck supple. No thyromegaly present.  Cardiovascular: Normal rate, regular rhythm, S1 normal, S2 normal and normal pulses.     No systolic murmur is present   No diastolic murmur is present  Pulses:      Radial pulses are 2+ on the right side, and 2+ on the left side.  Pulmonary/Chest: Effort normal and breath sounds normal. She has no wheezes. She has no rhonchi. She has no rales. She exhibits no tenderness.  Abdominal: Soft. Normal appearance and bowel sounds are normal. There is no CVA tenderness and negative Murphy's sign.       Tender right lower quadrant over McBurney's point  Musculoskeletal:       Moves all extremities x4 without deficits  Neurological: She is alert and oriented to person, place, and time. She has normal strength. No cranial nerve deficit or sensory deficit. GCS eye subscore is 4. GCS verbal subscore is 5. GCS motor subscore is 6.  Skin: Skin is warm and dry. No rash noted. She is not diaphoretic.  Psychiatric: Her speech is normal.       Cooperative and appropriate    ED Course  Procedures (including critical care time)  Results for orders placed during the hospital encounter of 03/20/12  CBC      Component Value Range   WBC 17.2 (*) 4.0 - 10.5 (K/uL)   RBC 5.37 (*) 3.87 -  5.11 (MIL/uL)   Hemoglobin 14.1  12.0 - 15.0 (g/dL)   HCT 52.8  41.3 - 24.4 (%)   MCV 78.8  78.0 - 100.0 (fL)   MCH 26.3  26.0 - 34.0 (pg)   MCHC 33.3  30.0 - 36.0 (g/dL)   RDW 01.0  27.2 - 53.6 (%)   Platelets 291  150 - 400 (K/uL)  COMPREHENSIVE METABOLIC PANEL      Component Value Range   Sodium 136  135 - 145 (mEq/L)   Potassium 3.7  3.5 - 5.1 (mEq/L)   Chloride 103  96 - 112 (mEq/L)   CO2 23  19 - 32 (mEq/L)   Glucose, Bld 103 (*) 70 - 99 (mg/dL)   BUN 5 (*) 6 - 23 (mg/dL)   Creatinine, Ser 6.44  0.50 - 1.10 (mg/dL)   Calcium 9.1  8.4 - 03.4 (mg/dL)    Total Protein 7.2  6.0 - 8.3 (g/dL)   Albumin 3.9  3.5 - 5.2 (g/dL)   AST 14  0 - 37 (U/L)   ALT 12  0 - 35 (U/L)   Alkaline Phosphatase 113  39 - 117 (U/L)   Total Bilirubin 0.3  0.3 - 1.2 (mg/dL)   GFR calc non Af Amer >90  >90 (mL/min)   GFR calc Af Amer >90  >90 (mL/min)  LIPASE, BLOOD      Component Value Range   Lipase 23  11 - 59 (U/L)  URINALYSIS, ROUTINE W REFLEX MICROSCOPIC      Component Value Range   Color, Urine YELLOW  YELLOW    APPearance CLEAR  CLEAR    Specific Gravity, Urine 1.005  1.005 - 1.030    pH 7.0  5.0 - 8.0    Glucose, UA NEGATIVE  NEGATIVE (mg/dL)   Hgb urine dipstick NEGATIVE  NEGATIVE    Bilirubin Urine NEGATIVE  NEGATIVE    Ketones, ur NEGATIVE  NEGATIVE (mg/dL)   Protein, ur NEGATIVE  NEGATIVE (mg/dL)   Urobilinogen, UA 1.0  0.0 - 1.0 (mg/dL)   Nitrite NEGATIVE  NEGATIVE    Leukocytes, UA NEGATIVE  NEGATIVE   PREGNANCY, URINE      Component Value Range   Preg Test, Ur NEGATIVE  NEGATIVE    Ct Abdomen Pelvis W Contrast  03/20/2012  *RADIOLOGY REPORT*  Clinical Data: Abdominal pain  CT ABDOMEN AND PELVIS WITH CONTRAST  Technique:  Multidetector CT imaging of the abdomen and pelvis was performed following the standard protocol during bolus administration of intravenous contrast.  Contrast: OMNIPAQUE IOHEXOL 300 MG/ML  SOLN  Comparison: None.  Findings: Limited images through the lung bases demonstrate no significant appreciable abnormality. The heart size is within normal limits. No pleural or pericardial effusion.  Unremarkable liver, biliary system, spleen, pancreas, adrenal glands.  Symmetric renal enhancement.  No hydronephrosis or hydroureter.  Distended appendix with periappendiceal fat stranding.  There is a small amount of periappendiceal fluid.  No free intraperitoneal air.  No loculated fluid collection.  Ileocolic reactive lymph nodes.  No bowel obstruction.  Normal caliber vasculature.  Partial decompressed bladder with circumferential  thickening, nonspecific.  CT appearance to the uterus and adnexa within normal limits.  No acute osseous abnormality.  IMPRESSION: Acute appendicitis.  Original Report Authenticated By: Waneta Martins, M.D.    IV narcotics and Zofran for pain control.  n.p.o.  MDM   Right lower quadrant abdominal pain evaluated with CAT scan, UA and labs as above.  6:56 AM d/w DR Magnus Ivan,  [facility], will eval in ED.       Sunnie Nielsen, MD 03/20/12 [phone]

## 2012-03-20 NOTE — H&P (Signed)
Reason for Consult:appendicitis Referring Physician:   LABRITTANY Cindy Livingston is an 26 y.o. female.  HPI: this patient presents with 36 hours of abdominal pain. She says began Saturday afternoon for generalized periumbilical pain but she says that eventually as located itself to the right lower quadrant. She has no other associated symptoms such as fevers or chills or nausea or vomiting. She says her bowels are normal and she has no history of ovarian problems. She has no urinary symptoms as well.  History reviewed. No pertinent past medical history.  History reviewed. No pertinent past surgical history.csection x2  No family history on file.  Social History:  reports that she has been smoking Cigarettes.  She does not have any smokeless tobacco history on file. She reports that she drinks alcohol. She reports that she uses illicit drugs (Marijuana).  Allergies:  Allergies  Allergen Reactions  . Codeine Rash    Medications: I have reviewed the patient's current medications.  Results for orders placed during the hospital encounter of 03/20/12 (from the past 48 hour(s))  CBC     Status: Abnormal   Collection Time   03/20/12  2:25 AM      Component Value Range Comment   WBC 17.2 (*) 4.0 - 10.5 (K/uL)    RBC 5.37 (*) 3.87 - 5.11 (MIL/uL)    Hemoglobin 14.1  12.0 - 15.0 (g/dL)    HCT 16.1  09.6 - 04.5 (%)    MCV 78.8  78.0 - 100.0 (fL)    MCH 26.3  26.0 - 34.0 (pg)    MCHC 33.3  30.0 - 36.0 (g/dL)    RDW 40.9  81.1 - 91.4 (%)    Platelets 291  150 - 400 (K/uL)   COMPREHENSIVE METABOLIC PANEL     Status: Abnormal   Collection Time   03/20/12  2:25 AM      Component Value Range Comment   Sodium 136  135 - 145 (mEq/L)    Potassium 3.7  3.5 - 5.1 (mEq/L)    Chloride 103  96 - 112 (mEq/L)    CO2 23  19 - 32 (mEq/L)    Glucose, Bld 103 (*) 70 - 99 (mg/dL)    BUN 5 (*) 6 - 23 (mg/dL)    Creatinine, Ser 7.82  0.50 - 1.10 (mg/dL)    Calcium 9.1  8.4 - 10.5 (mg/dL)    Total Protein 7.2  6.0  - 8.3 (g/dL)    Albumin 3.9  3.5 - 5.2 (g/dL)    AST 14  0 - 37 (U/L)    ALT 12  0 - 35 (U/L)    Alkaline Phosphatase 113  39 - 117 (U/L)    Total Bilirubin 0.3  0.3 - 1.2 (mg/dL)    GFR calc non Af Amer >90  >90 (mL/min)    GFR calc Af Amer >90  >90 (mL/min)   LIPASE, BLOOD     Status: Normal   Collection Time   03/20/12  2:25 AM      Component Value Range Comment   Lipase 23  11 - 59 (U/L)   URINALYSIS, ROUTINE W REFLEX MICROSCOPIC     Status: Normal   Collection Time   03/20/12  2:30 AM      Component Value Range Comment   Color, Urine YELLOW  YELLOW     APPearance CLEAR  CLEAR     Specific Gravity, Urine 1.005  1.005 - 1.030     pH 7.0  5.0 -  8.0     Glucose, UA NEGATIVE  NEGATIVE (mg/dL)    Hgb urine dipstick NEGATIVE  NEGATIVE     Bilirubin Urine NEGATIVE  NEGATIVE     Ketones, ur NEGATIVE  NEGATIVE (mg/dL)    Protein, ur NEGATIVE  NEGATIVE (mg/dL)    Urobilinogen, UA 1.0  0.0 - 1.0 (mg/dL)    Nitrite NEGATIVE  NEGATIVE     Leukocytes, UA NEGATIVE  NEGATIVE  MICROSCOPIC NOT DONE ON URINES WITH NEGATIVE PROTEIN, BLOOD, LEUKOCYTES, NITRITE, OR GLUCOSE <1000 mg/dL.  PREGNANCY, URINE     Status: Normal   Collection Time   03/20/12  2:30 AM      Component Value Range Comment   Preg Test, Ur NEGATIVE  NEGATIVE      Ct Abdomen Pelvis W Contrast  03/20/2012  *RADIOLOGY REPORT*  Clinical Data: Abdominal pain  CT ABDOMEN AND PELVIS WITH CONTRAST  Technique:  Multidetector CT imaging of the abdomen and pelvis was performed following the standard protocol during bolus administration of intravenous contrast.  Contrast: OMNIPAQUE IOHEXOL 300 MG/ML  SOLN  Comparison: None.  Findings: Limited images through the lung bases demonstrate no significant appreciable abnormality. The heart size is within normal limits. No pleural or pericardial effusion.  Unremarkable liver, biliary system, spleen, pancreas, adrenal glands.  Symmetric renal enhancement.  No hydronephrosis or hydroureter.   Distended appendix with periappendiceal fat stranding.  There is a small amount of periappendiceal fluid.  No free intraperitoneal air.  No loculated fluid collection.  Ileocolic reactive lymph nodes.  No bowel obstruction.  Normal caliber vasculature.  Partial decompressed bladder with circumferential thickening, nonspecific.  CT appearance to the uterus and adnexa within normal limits.  No acute osseous abnormality.  IMPRESSION: Acute appendicitis.  Original Report Authenticated By: Waneta Martins, M.D.    @ROS @ Blood pressure 108/68, pulse 90, temperature 98.1 F (36.7 C), temperature source Oral, resp. rate 18, last menstrual period 03/14/2012, SpO2 96.00%. General appearance: alert, cooperative and no distress Eyes: negative Neck: no JVD and supple, symmetrical, trachea midline Resp: clear to auscultation bilaterally Cardio: normal rate, regular GI: soft, RLQ tenderness, focal in RLQ, +heel tap, ND, no diffuse peritonitis or rebound Extremities: extremities normal, atraumatic, no cyanosis or edema Skin: Skin color, texture, turgor normal. No rashes or lesions  Assessment/Plan: Right lower quadrant pain and likely appendicitis She has a history and exam which are fairly classic for acute appendicitis. She also has a CT scan concerning for appendicitis with inflammatory changes in the right lower quadrant without any signs of perforation. I discussed with her the options for surgery and I recommended diagnostic laparoscopy and microscopic appendectomy. Discussed the risks of the procedure including infection, bleeding, pain, scarring, negative laparoscopy, staple line leak, and need for open surgery and she expressed understanding and desires to proceed with laparoscopic appendectomy and possible open.  Cindy Livingston Cindy Livingston Cindy Livingston 03/20/2012, 7:59 AM

## 2012-03-20 NOTE — Anesthesia Preprocedure Evaluation (Addendum)
Anesthesia Evaluation  Patient identified by MRN, date of birth, ID band Patient awake    Reviewed: Allergy & Precautions, H&P , NPO status , Patient's Chart, lab work & pertinent test results, reviewed documented beta blocker date and time   Airway Mallampati: II      Dental  (+) Teeth Intact   Pulmonary  breath sounds clear to auscultation        Cardiovascular Rhythm:Regular Rate:Normal     Neuro/Psych    GI/Hepatic   Endo/Other    Renal/GU      Musculoskeletal   Abdominal (+)  Abdomen: tender.    Peds  Hematology   Anesthesia Other Findings   Reproductive/Obstetrics                           Anesthesia Physical Anesthesia Plan  ASA: II and Emergent  Anesthesia Plan: General   Post-op Pain Management:    Induction: Intravenous  Airway Management Planned: Oral ETT  Additional Equipment:   Intra-op Plan:   Post-operative Plan: Extubation in OR  Informed Consent: I have reviewed the patients History and Physical, chart, labs and discussed the procedure including the risks, benefits and alternatives for the proposed anesthesia with the patient or authorized representative who has indicated his/her understanding and acceptance.   Dental advisory given  Plan Discussed with:   Anesthesia Plan Comments: (Smoker Acute Appendicitis  Plan GA with RSI  Kipp Brood, MD)        Anesthesia Quick Evaluation

## 2012-03-20 NOTE — ED Notes (Signed)
Pt ambulatory into room from triage, alert, NAD, calm, interactive.  EDP into room upon pt arrival. Pt seen by EDP prior to RN assessment, see MD notes, pending orders.

## 2012-03-20 NOTE — ED Notes (Signed)
Pt to CT at this time.

## 2012-03-20 NOTE — ED Notes (Signed)
Patient with abdominal pain.  Patient states she did have some nausea last night.  No vomiting.  Patient states that the pain comes and goes.

## 2012-03-20 NOTE — Transfer of Care (Signed)
Immediate Anesthesia Transfer of Care Note  Patient: Cindy Livingston  Procedure(s) Performed: Procedure(s) (LRB): APPENDECTOMY LAPAROSCOPIC (N/A)  Patient Location: PACU  Anesthesia Type: General  Level of Consciousness: sedated  Airway & Oxygen Therapy: Patient Spontanous Breathing and Patient connected to nasal cannula oxygen  Post-op Assessment: Report given to PACU RN and Post -op Vital signs reviewed and stable  Post vital signs: Reviewed and stable  Complications: No apparent anesthesia complications

## 2012-03-20 NOTE — Preoperative (Signed)
Beta Blockers   Reason not to administer Beta Blockers:Not Applicable 

## 2012-03-20 NOTE — ED Notes (Signed)
atttempted to call report to OR and nurse unable to take report at this time.

## 2012-03-20 NOTE — Op Note (Signed)
NAME:  Malloy, Shalaunda                ACCOUNT NO.:  [id number]  MEDICAL RECORD NO.:  [id number]  LOCATION:  5121                         FACILITY:  [facility]  PHYSICIAN:  [name], MD       DATE OF BIRTH:  24-Aug-1986  DATE OF PROCEDURE:  03/20/2012 DATE OF DISCHARGE:                              OPERATIVE REPORT   PROCEDURE:  Laparoscopic appendectomy.  PREOPERATIVE DIAGNOSIS:  Acute appendicitis.  POSTOPERATIVE DIAGNOSIS:  Acute appendicitis..  SURGEON:  [name], MD  ASSISTANT:  None.  ANESTHESIA:  General endotracheal anesthesia with 30 mL of 1% lidocaine with epinephrine and 0.25% Marcaine in a 50:50 mixture.  FLUIDS:  1600 mL of crystalloid.  ESTIMATED BLOOD LOSS:  Minimal.  DRAINS:  None.  SPECIMENS:  Appendix, sent to pathology for permanent sectioning.  COMPLICATIONS:  None apparent.  FINDINGS:  Acute appendicitis without evidence of perforation.  No other obvious intra-abdominal pathology is identified.  INDICATION FOR PROCEDURE:  Ms. Biskup is a 26 year old female with a day and half history of increasing abdominal pain, which is localized to the right lower quadrant.  She had a CT scan concerning for acute appendicitis and focal tenderness in the right lower quadrant.  OPERATIVE DETAILS:  Ms. Sciara was seen and evaluated in the emergency room and risks and benefits of the procedure were discussed in lay terms.  Informed consent was obtained.  Prophylactic antibiotics were given.  She was taken to operating room, placed on the table in the supine position and general endotracheal anesthesia was obtained.  A Foley catheter was placed and her left arm was tucked, and she was taken to table.  Her abdomen was prepped and draped in a standard surgical fashion, and procedure time-out was performed with all operative team members to confirm proper patient, procedure, and then an infraumbilical semicircular incision was made in the skin and dissection carried  down to the abdominal wall fascia using blunt dissection.  Abdominal wall fascia was elevated and sharply incised and entered bluntly.  A 12 mm balloon port was placed in the peritoneum, and pneumoperitoneum was obtained.  Laparoscope was introduced, and there was no evidence of bowel injury upon entry.  She did appear to have some adhesions to the abdominal wall in the right lower quadrant, but no other significant intra-abdominal pathology.  I placed a left lower quadrant trocar, and under direct visualization and a suprapubic 5 mm trocar under direct visualization.  This allowed me to visualize the appendix which was stuck in a retrocecal fashion.  There was a bend in the mid portion appendix which appeared to be the source of the problem with the thickening and inflammatory change.  The mesoappendix was thickened as well, mobilize the peritoneum laterally using sharp dissection to allow me to elevate the appendix, and a window was created through the mesoappendix near its base.  Then, a purple load on the Endo-GIA stapler was used to transect the appendix at the base.  The cecum care was taken to avoid injury to the terminal ileum or cecum or adjacent structures. Staples appeared well formed, and the staple line was hemostatic.  Then, the appendix was elevated and a thin  vascular load was used to ligate the mesoappendix near its base taking care to avoid injury to surrounding structures.  The staple line appeared hemostatic as well. The staples appeared well formed, and the appendix was placed in a bag and removed from the umbilical trocar in an EndoCatch bag and sent to pathology for permanent sectioning.  The right lower quadrant was irrigated and suctioned.  There was no gross contamination.  The staple lines again were noted to be hemostatic.  There was no evidence of bowel injury or bleeding, and the left lower quadrant trocar was removed under direct visualization, and abdominal  wall was noted be hemostatic and the umbilical fascia was then approximated with interrupted 0 Vicryl sutures in an open fashion.  Sutures were secured, and the abdomen was reinsufflated through the suprapubic trocar site, and the abdominal wall closure was noted to be adequate without any evidence of bowel injury. The right lower quadrant appeared be hemostatic without any evidence of bowel injury.  The gas removed and the trocars removed and the wounds were injected with 30 mL of 1% lidocaine with epinephrine, 0.25% Marcaine in a 50:50 mixture.  Skin edges were approximated with 4-0 Monocryl subcuticular suture.  Skin was washed and dried and Dermabond was applied.  All sponge, needle, instrument counts correct in the case, and the patient tolerated the procedure well without apparent complication.  Foley catheter is removed, and she was ready for transfer to the PACU in stable condition.          ______________________________ [name], MD     BL/MEDQ  D:  03/20/2012  T:  03/20/2012  Job:  [id number]

## 2012-03-21 ENCOUNTER — Encounter (HOSPITAL_COMMUNITY): Payer: Self-pay | Admitting: *Deleted

## 2012-03-21 MED ORDER — HYDROMORPHONE HCL 2 MG PO TABS: 2.0000 mg | ORAL_TABLET | ORAL | Status: AC | PRN

## 2012-03-21 NOTE — Progress Notes (Signed)
Patient ID: Cindy Livingston, female   DOB: 1986/10/14, 26 y.o.   MRN: [id number] 1 Day Post-Op  Subjective: Pt c/o some chest pain.  No SOB.  Feels worse when lays down, but goes away when she stands up.  It is intermittent.  Objective: Vital signs in last 24 hours: Temp:  [97.6 F (36.4 C)-98.5 F (36.9 C)] 97.6 F (36.4 C) (04/30 1404) Pulse Rate:  [71-94] 84  (04/30 1404) Resp:  [16-19] 19  (04/30 1404) BP: (99-114)/(52-67) 108/56 mmHg (04/30 1404) SpO2:  [97 %-99 %] 99 % (04/30 1404) Last BM Date: 03/18/12  Intake/Output from previous day: 04/29 0701 - 04/30 0700 In: 2701.7 [P.O.:360; I.V.:2341.7] Out: 175 [Urine:175] Intake/Output this shift:    PE: Abd: soft, appropriately tender, +BS, ND, incisions c/d/i Heart: regular Lungs: CTAB  Lab Results:   Basename 03/20/12 0225  WBC 17.2*  HGB 14.1  HCT 42.3  PLT 291   BMET  Basename 03/20/12 0225  NA 136  K 3.7  CL 103  CO2 23  GLUCOSE 103*  BUN 5*  CREATININE 0.68  CALCIUM 9.1   PT/INR No results found for this basename: LABPROT:2,INR:2 in the last 72 hours CMP     Component Value Date/Time   NA 136 03/20/2012 0225   K 3.7 03/20/2012 0225   CL 103 03/20/2012 0225   CO2 23 03/20/2012 0225   GLUCOSE 103* 03/20/2012 0225   BUN 5* 03/20/2012 0225   CREATININE 0.68 03/20/2012 0225   CALCIUM 9.1 03/20/2012 0225   PROT 7.2 03/20/2012 0225   ALBUMIN 3.9 03/20/2012 0225   AST 14 03/20/2012 0225   ALT 12 03/20/2012 0225   ALKPHOS 113 03/20/2012 0225   BILITOT 0.3 03/20/2012 0225   GFRNONAA >90 03/20/2012 0225   GFRAA >90 03/20/2012 0225   Lipase     Component Value Date/Time   LIPASE 23 03/20/2012 0225       Studies/Results: Ct Abdomen Pelvis W Contrast  03/20/2012  *RADIOLOGY REPORT*  Clinical Data: Abdominal pain  CT ABDOMEN AND PELVIS WITH CONTRAST  Technique:  Multidetector CT imaging of the abdomen and pelvis was performed following the standard protocol during bolus administration of intravenous contrast.   Contrast: OMNIPAQUE IOHEXOL 300 MG/ML  SOLN  Comparison: None.  Findings: Limited images through the lung bases demonstrate no significant appreciable abnormality. The heart size is within normal limits. No pleural or pericardial effusion.  Unremarkable liver, biliary system, spleen, pancreas, adrenal glands.  Symmetric renal enhancement.  No hydronephrosis or hydroureter.  Distended appendix with periappendiceal fat stranding.  There is a small amount of periappendiceal fluid.  No free intraperitoneal air.  No loculated fluid collection.  Ileocolic reactive lymph nodes.  No bowel obstruction.  Normal caliber vasculature.  Partial decompressed bladder with circumferential thickening, nonspecific.  CT appearance to the uterus and adnexa within normal limits.  No acute osseous abnormality.  IMPRESSION: Acute appendicitis.  Original Report Authenticated By: Waneta Martins, M.D.    Anti-infectives: Anti-infectives     Start     Dose/Rate Route Frequency Ordered Stop   03/20/12 0700   ertapenem (INVANZ) 1 g in sodium chloride 0.9 % 50 mL IVPB        1 g 100 mL/hr over 30 Minutes Intravenous  Once 03/20/12 0655 03/20/12 0811           Assessment/Plan  1. S/p lap appy 2. CP  Plan: 1. Suspect CP likely secondary to residual gas in abdomen.  Will observe  for now. 2. From a surgical standpoint patient is doing well.  If CP better tomorrow, will look at dc home then.   LOS: 1 day    OSBORNE,KELLY E 03/21/2012  She feels better and looks fine.  Abdomen appropriate.  She has some positional chest pain so we are holding her to ensure that this resolves.  No SOB and sats normal.  HR normal. Low suspicion for serious problem such as PE or MI

## 2012-03-21 NOTE — Anesthesia Postprocedure Evaluation (Signed)
  Anesthesia Post-op Note  Patient: Cindy Livingston  Procedure(s) Performed: Procedure(s) (LRB): APPENDECTOMY LAPAROSCOPIC (N/A)  Patient Location: PACU and Nursing Unit  Anesthesia Type: General  Level of Consciousness: awake, alert  and oriented  Airway and Oxygen Therapy: Patient Spontanous Breathing and Patient connected to nasal cannula oxygen  Post-op Pain: mild  Post-op Assessment: Post-op Vital signs reviewed, Patient's Cardiovascular Status Stable, Respiratory Function Stable, Patent Airway, No signs of Nausea or vomiting and Adequate PO intake  Post-op Vital Signs: Reviewed and stable  Complications: No apparent anesthesia complications

## 2012-03-21 NOTE — Discharge Instructions (Signed)
[facility] ______[facility] °LAPAROSCOPIC SURGERY: POST OP INSTRUCTIONS °Always review your discharge instruction sheet given to you by the facility where your surgery was performed. °IF YOU HAVE DISABILITY OR FAMILY LEAVE FORMS, YOU MUST BRING THEM TO THE OFFICE FOR PROCESSING.   °DO NOT GIVE THEM TO YOUR DOCTOR. ° °1. A prescription for pain medication may be given to you upon discharge.  Take your pain medication as prescribed, if needed.  If narcotic pain medicine is not needed, then you may take acetaminophen (Tylenol) or ibuprofen (Advil) as needed. °2. Take your usually prescribed medications unless otherwise directed. °3. If you need a refill on your pain medication, please contact your pharmacy.  They will contact our office to request authorization. Prescriptions will not be filled after 5pm or on week-ends. °4. You should follow a light diet the first few days after arrival home, such as soup and crackers, etc.  Be sure to include lots of fluids daily. °5. Most patients will experience some swelling and bruising in the area of the incisions.  Ice packs will help.  Swelling and bruising can take several days to resolve.  °6. It is common to experience some constipation if taking pain medication after surgery.  Increasing fluid intake and taking a stool softener (such as Colace) will usually help or prevent this problem from occurring.  A mild laxative (Milk of Magnesia or Miralax) should be taken according to package instructions if there are no bowel movements after 48 hours. °7. Unless discharge instructions indicate otherwise, you may remove your bandages 24-48 hours after surgery, and you may shower at that time.  You may have steri-strips (small skin tapes) in place directly over the incision.  These strips should be left on the skin for 7-10 days.  If your surgeon used skin glue on the incision, you may shower in 24 hours.  The glue will flake off over the next 2-3 weeks.  Any sutures or  staples will be removed at the office during your follow-up visit. °8. ACTIVITIES:  You may resume regular (light) daily activities beginning the next day--such as daily self-care, walking, climbing stairs--gradually increasing activities as tolerated.  You may have sexual intercourse when it is comfortable.  Refrain from any heavy lifting or straining until approved by your doctor. °a. You may drive when you are no longer taking prescription pain medication, you can comfortably wear a seatbelt, and you can safely maneuver your car and apply brakes. °b. RETURN TO WORK:  __________________________________________________________ °9. You should see your doctor in the office for a follow-up appointment approximately 2-3 weeks after your surgery.  Make sure that you call for this appointment within a day or two after you arrive home to insure a convenient appointment time. °10. OTHER INSTRUCTIONS: __________________________________________________________________________________________________________________________ __________________________________________________________________________________________________________________________ °WHEN TO CALL YOUR DOCTOR: °1. Fever over 101.0 °2. Inability to urinate °3. Continued bleeding from incision. °4. Increased pain, redness, or drainage from the incision. °5. Increasing abdominal pain ° °The clinic staff is available to answer your questions during regular business hours.  Please don’t hesitate to call and ask to speak to one of the nurses for clinical concerns.  If you have a medical emergency, go to the nearest emergency room or call 911.  A surgeon from [facility] is always on call at the hospital. °[address], [address], [place], [place]  [address] ? [address] [address], [place], [place]   [address] °[phone] ? [phone] ? FAX [phone] °Web site:   [email or web address] °

## 2012-03-21 NOTE — Discharge Summary (Signed)
  Patient ID: Cindy Livingston MRN: [id number] DOB/AGE: 1986-01-14 26 y.o.  Admit date: 03/20/2012 Discharge date: 03/21/2012  Procedures: lap appy  Consults: None  Reason for Admission: this is a 26 yo who presented to [facility] with RLQ abdominal pain.  She had a CT scan and was found to have acute appendicitis.  Admission Diagnoses:  1. Acute appendicitis  Hospital Course: The patient was admitted and taken to the operating room where she underwent a lap appy.  She tolerated the procedure well.  She had some slight nausea the day of surgery but this improved.  She did not have a great appetite on POD# 1, but was able to tolerate a regular diet.  She did c/o some "chest pain" on POD# 1 that is intermittent.  When she stands up it hurts, but after walking around it goes away.  When she sits back down sometimes it returns but then goes away again.  She denied any SOB.  She was not tachycardic and her vital signs were all stable  This was felt to be residual gas irritation of her diaphragm that was causing radiating pains up into her shoulders and chest.  She was observed for another day.  She still c/o the same type pains on POD# 2 and a CTA was done to rule out any complications.  This was negative.  The patient was doing well and tolerating a diet.  She was not felt stable for discharge home.   Discharge Diagnoses:  Acute appendicitis S/p lap appy  Discharge Medications: Medication List  As of 03/21/2012  9:37 AM   TAKE these medications         HYDROmorphone 2 MG tablet   Commonly known as: DILAUDID   Take 1 tablet (2 mg total) by mouth every 3 (three) hours as needed.            Discharge Instructions: Follow-up Information    Follow up with LAYTON, BRIAN DAVID, DO. Schedule an appointment as soon as possible for a visit in 3 weeks.   Contact information:   [address] [place] [address] [phone]          Signed: Letha Cape 03/21/2012, 9:37 AM

## 2012-03-22 ENCOUNTER — Observation Stay (HOSPITAL_COMMUNITY): Payer: Self-pay

## 2012-03-22 ENCOUNTER — Encounter (HOSPITAL_COMMUNITY): Payer: Self-pay | Admitting: Radiology

## 2012-03-22 DIAGNOSIS — R079 Chest pain, unspecified: Secondary | ICD-10-CM

## 2012-03-22 MED ORDER — ACETAMINOPHEN 325 MG PO TABS
650.0000 mg | ORAL_TABLET | Freq: Four times a day (QID) | ORAL | Status: DC | PRN
  Administered 2012-03-22: 650 mg via ORAL
  Filled 2012-03-22: qty 2

## 2012-03-22 MED ORDER — IOHEXOL 350 MG/ML SOLN
100.0000 mL | Freq: Once | INTRAVENOUS | Status: AC | PRN
  Administered 2012-03-22: 100 mL via INTRAVENOUS

## 2012-03-22 NOTE — Progress Notes (Addendum)
Patient ID: Cindy Livingston, female   DOB: 1986/04/04, 26 y.o.   MRN: [id number] 2 Days Post-Op  Subjective: Pt still c/o pain that radiates to her shoulders.  She denies SOB.  She is tolerating a solid diet with no nausea.  Abdominal pain better.  Objective: Vital signs in last 24 hours: Temp:  [97.6 F (36.4 C)-98.9 F (37.2 C)] 98.9 F (37.2 C) (05/01 0523) Pulse Rate:  [84-105] 105  (05/01 0523) Resp:  [16-19] 16  (05/01 0523) BP: (99-115)/(56-68) 115/68 mmHg (05/01 0523) SpO2:  [97 %-99 %] 97 % (05/01 0523) Last BM Date: 03/18/12  Intake/Output from previous day: 04/30 0701 - 05/01 0700 In: 363 [P.O.:360; I.V.:3] Out: -  Intake/Output this shift:    PE: Abd: soft, minimal tenderness, +BS, ND, incisions c/d/i Heart: regular Lungs: CTAB  Lab Results:   Basename 03/20/12 0225  WBC 17.2*  HGB 14.1  HCT 42.3  PLT 291   BMET  Basename 03/20/12 0225  NA 136  K 3.7  CL 103  CO2 23  GLUCOSE 103*  BUN 5*  CREATININE 0.68  CALCIUM 9.1   PT/INR No results found for this basename: LABPROT:2,INR:2 in the last 72 hours CMP     Component Value Date/Time   NA 136 03/20/2012 0225   K 3.7 03/20/2012 0225   CL 103 03/20/2012 0225   CO2 23 03/20/2012 0225   GLUCOSE 103* 03/20/2012 0225   BUN 5* 03/20/2012 0225   CREATININE 0.68 03/20/2012 0225   CALCIUM 9.1 03/20/2012 0225   PROT 7.2 03/20/2012 0225   ALBUMIN 3.9 03/20/2012 0225   AST 14 03/20/2012 0225   ALT 12 03/20/2012 0225   ALKPHOS 113 03/20/2012 0225   BILITOT 0.3 03/20/2012 0225   GFRNONAA >90 03/20/2012 0225   GFRAA >90 03/20/2012 0225   Lipase     Component Value Date/Time   LIPASE 23 03/20/2012 0225       Studies/Results: No results found.  Anti-infectives: Anti-infectives     Start     Dose/Rate Route Frequency Ordered Stop   03/20/12 0700   ertapenem (INVANZ) 1 g in sodium chloride 0.9 % 50 mL IVPB        1 g 100 mL/hr over 30 Minutes Intravenous  Once 03/20/12 0655 03/20/12 0811            Assessment/Plan  1. S/p lap appy 2. Shoulder/chest pain  Plan: 1. Per Dr. Biagio Quint, he would like to get a CTA to rule out any significant complication such as a PE.  If this is negative, then the patient is surgically stable and anticipate discharge home.   LOS: 2 days    Bentley Fissel E 03/22/2012 She seems to be fine and actually feeling better in her abdomen but she still has some pleuritic right sided chest pain. Doubt PE since she is breathing comfortably, HR and resp and sats normal but since young female and smoker will check CTA.  If CTA is negative, she should be okay for discharge.  ADDENDUM: CTA negative for any acute problem.  Suspect this is secondary to residual post op gas pains.  Patient is stable for discharge home.  Annalei Friesz E 12:10 PM

## 2012-03-22 NOTE — Progress Notes (Signed)
DC home with family. No verbal questions about DC instructions.

## 2012-08-17 IMAGING — CT CT ABD-PELV W/ CM
2 of 4 series · 14 of 32 positions shown, 19 images · IV contrast (water/omni  & 100ml omni 300)
Comparison: None.

CLINICAL DATA: Abdominal pain

CT ABDOMEN AND PELVIS WITH CONTRAST
TECHNIQUE: Multidetector CT imaging of the abdomen and pelvis was
performed following the standard protocol during bolus
administration of intravenous contrast.
Contrast: 100mL OMNIPAQUE IOHEXOL 300 MG/ML  SOLN

[Series 2: routine abdomen · axial · 0.70mm/px · z∈[-434,-104]mm · 7 of 90 slices shown, 12 images]
[im 12/90  soft-tissue]
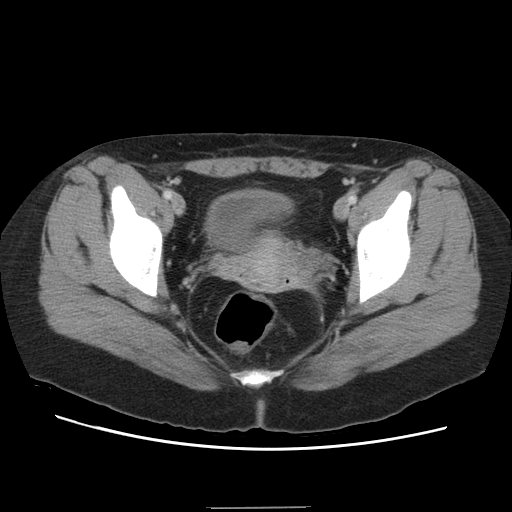
[im 12/90  bone]
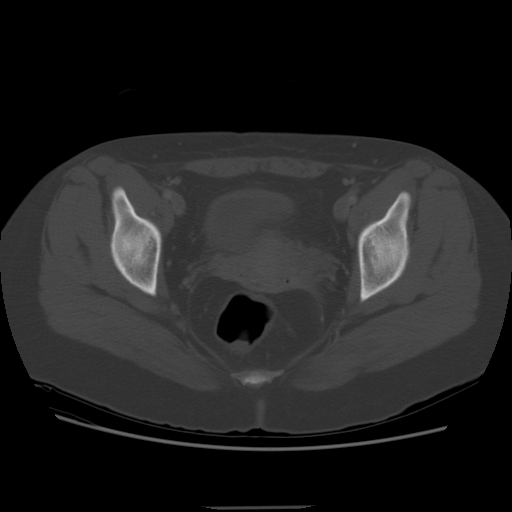
[im 23/90  soft-tissue]
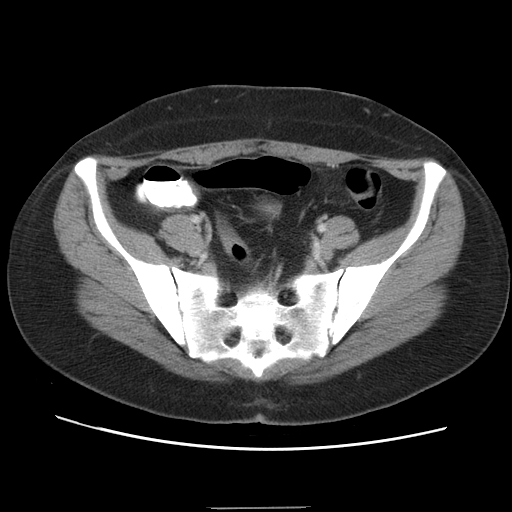
[im 34/90  soft-tissue]
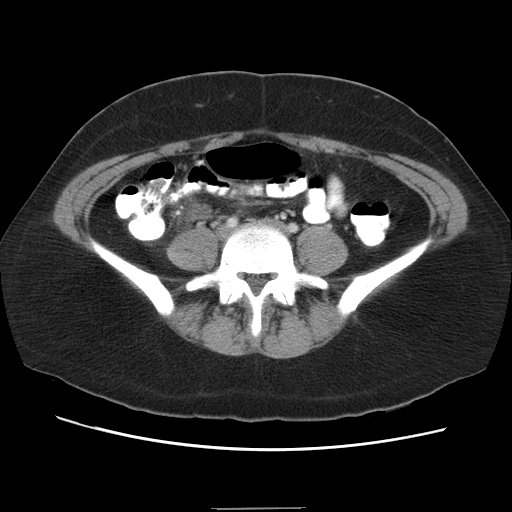
[im 45/90  soft-tissue]
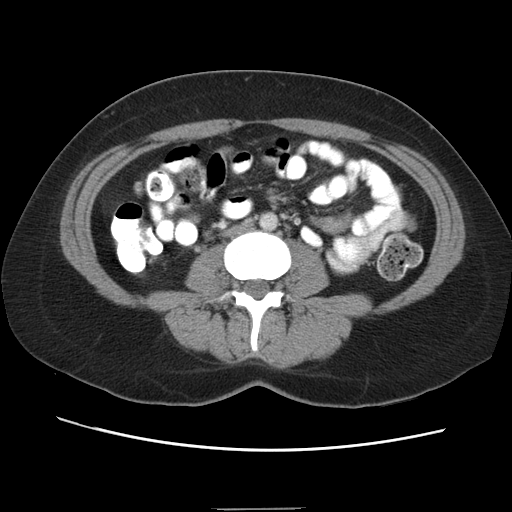
[im 45/90  lung]
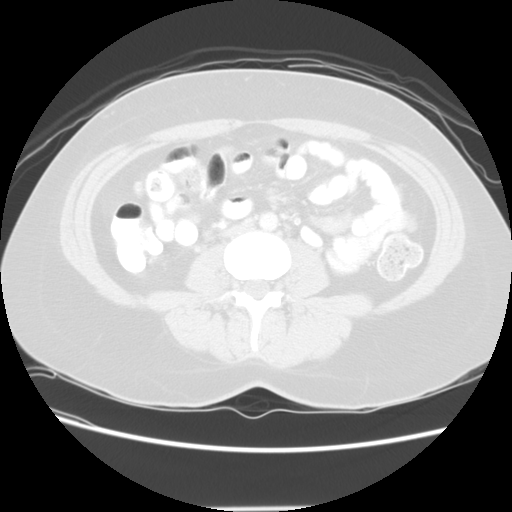
[im 56/90  soft-tissue]
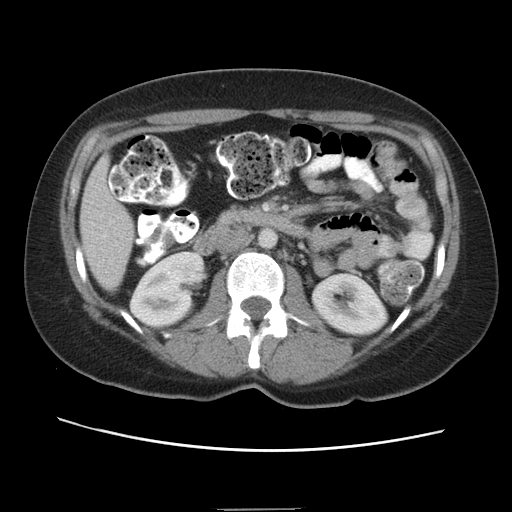
[im 56/90  lung]
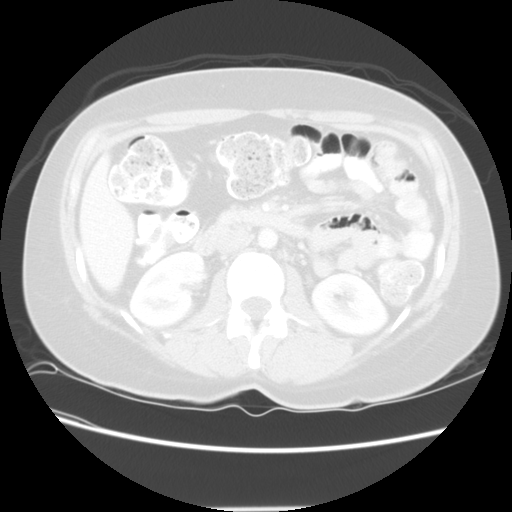
[im 67/90  soft-tissue]
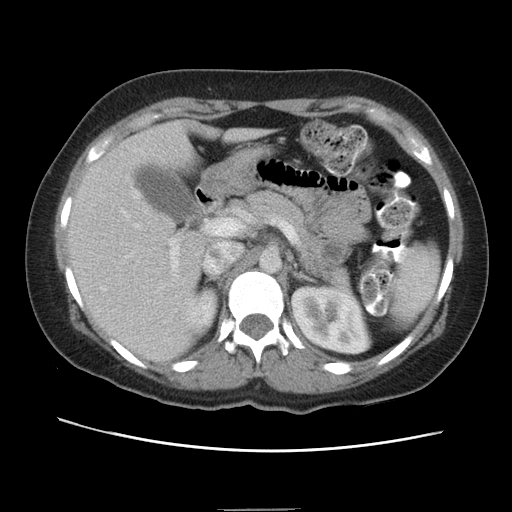
[im 67/90  lung]
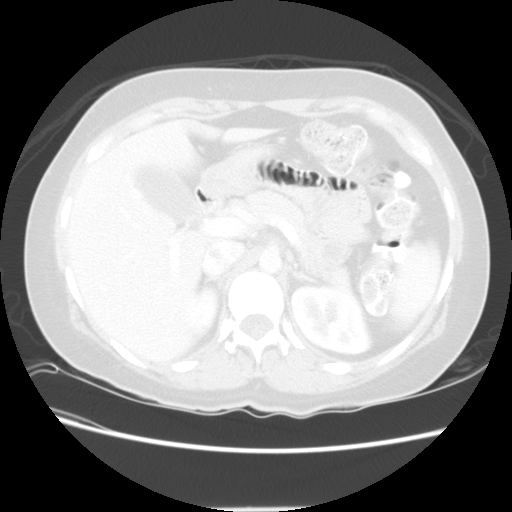
[im 78/90  soft-tissue]
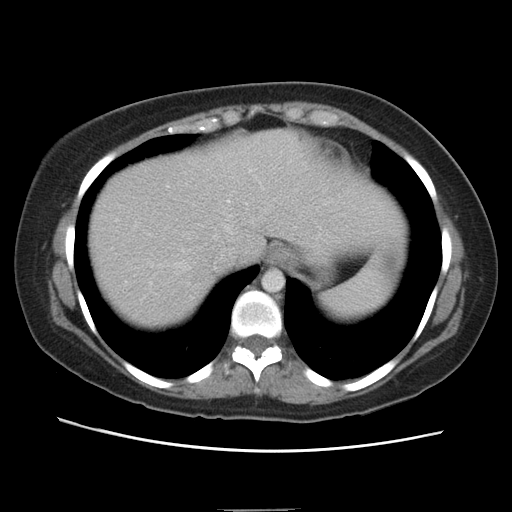
[im 78/90  lung]
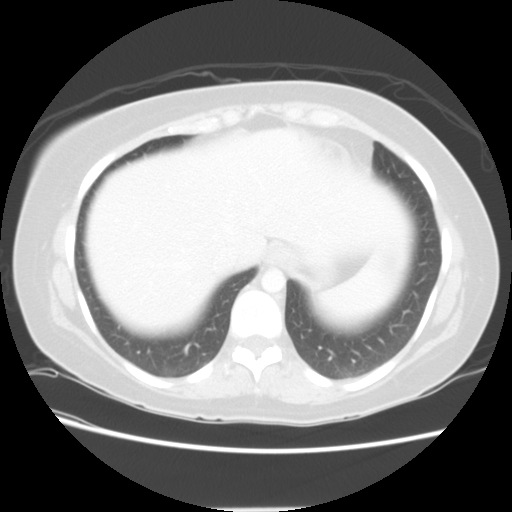

[Series 401: sag · sagittal · 0.97mm/px · 7 of 108 slices shown]
[im 10/108  soft-tissue]
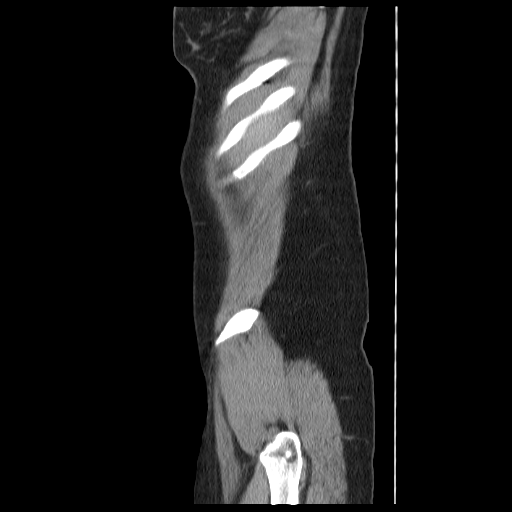
[im 20/108  soft-tissue]
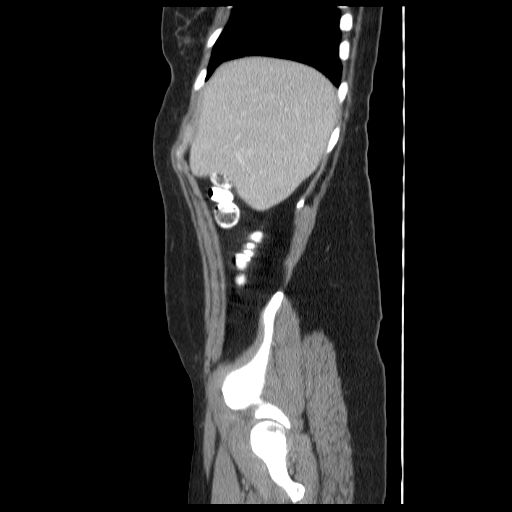
[im 39/108  soft-tissue]
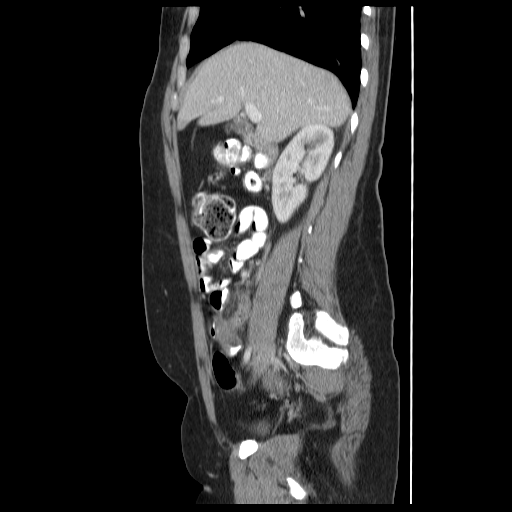
[im 49/108  soft-tissue]
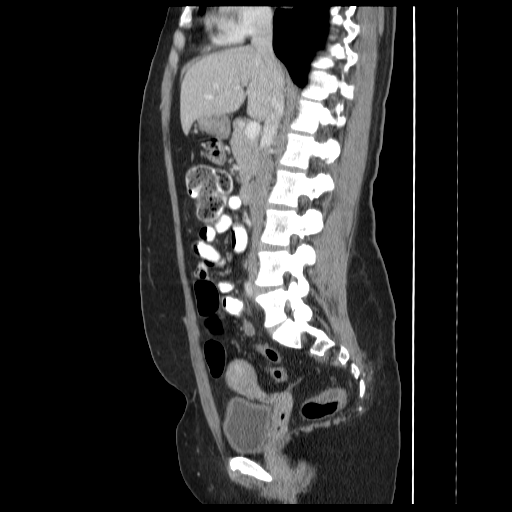
[im 59/108  soft-tissue]
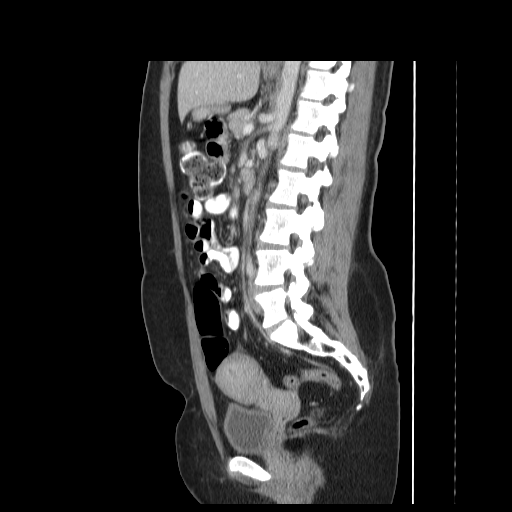
[im 69/108  soft-tissue]
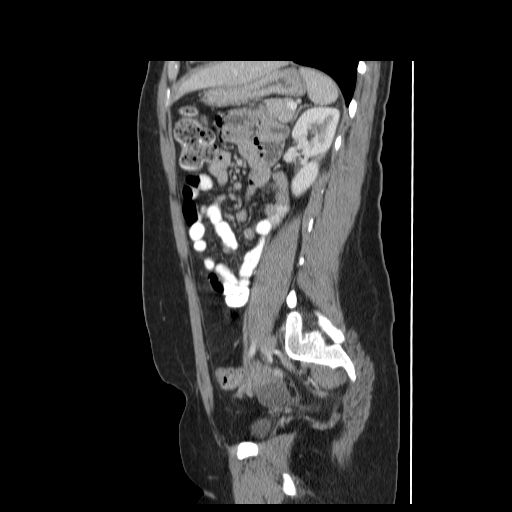
[im 88/108  soft-tissue]
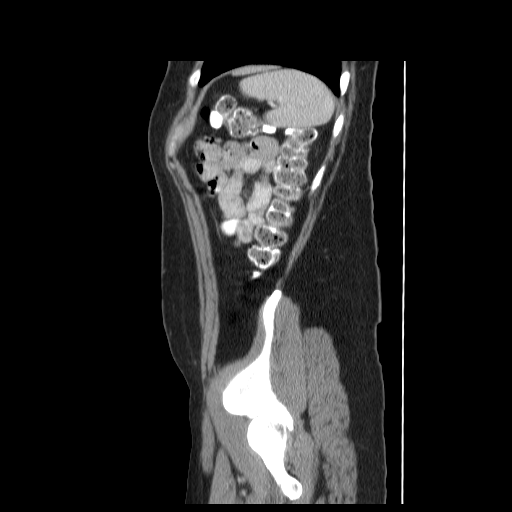

[14 of 32 positions shown; findings below may reference images not displayed]

FINDINGS: Limited images through the lung bases demonstrate no
significant appreciable abnormality. The heart size is within
normal limits. No pleural or pericardial effusion.

Unremarkable liver, biliary system, spleen, pancreas, adrenal
glands.

Symmetric renal enhancement.  No hydronephrosis or hydroureter.

Distended appendix with periappendiceal fat stranding.  There is a
small amount of periappendiceal fluid.  No free intraperitoneal
air.  No loculated fluid collection.  Ileocolic reactive lymph
nodes.  No bowel obstruction.

Normal caliber vasculature.

Partial decompressed bladder with circumferential thickening,
nonspecific.  CT appearance to the uterus and adnexa within normal
limits.

No acute osseous abnormality.
IMPRESSION: Acute appendicitis.

## 2013-08-09 ENCOUNTER — Emergency Department (INDEPENDENT_AMBULATORY_CARE_PROVIDER_SITE_OTHER)
Admission: EM | Admit: 2013-08-09 | Discharge: 2013-08-09 | Disposition: A | Discharge status: Home / Self Care | Payer: Self-pay | Source: Home / Self Care | Attending: Emergency Medicine | Admitting: Emergency Medicine

## 2013-08-09 ENCOUNTER — Encounter (HOSPITAL_COMMUNITY): Payer: Self-pay | Admitting: Emergency Medicine

## 2013-08-09 DIAGNOSIS — H6690 Otitis media, unspecified, unspecified ear: Secondary | ICD-10-CM

## 2013-08-09 DIAGNOSIS — J019 Acute sinusitis, unspecified: Secondary | ICD-10-CM

## 2013-08-09 LAB — POCT RAPID STREP A: Streptococcus, Group A Screen (Direct): NEGATIVE

## 2013-08-09 MED ORDER — DEXAMETHASONE SODIUM PHOSPHATE 10 MG/ML IJ SOLN
10.0000 mg | Freq: Once | INTRAMUSCULAR | Status: AC
  Administered 2013-08-09: 10 mg via INTRAMUSCULAR

## 2013-08-09 MED ORDER — DEXAMETHASONE SODIUM PHOSPHATE 10 MG/ML IJ SOLN
INTRAMUSCULAR | Status: AC
  Filled 2013-08-09: qty 1

## 2013-08-09 MED ORDER — KETOROLAC TROMETHAMINE 60 MG/2ML IM SOLN
60.0000 mg | Freq: Once | INTRAMUSCULAR | Status: AC
  Administered 2013-08-09: 60 mg via INTRAMUSCULAR

## 2013-08-09 MED ORDER — AMOXICILLIN 875 MG PO TABS: 875.0000 mg | ORAL_TABLET | Freq: Two times a day (BID) | ORAL | Status: DC

## 2013-08-09 MED ORDER — KETOROLAC TROMETHAMINE 60 MG/2ML IM SOLN
INTRAMUSCULAR | Status: AC
  Filled 2013-08-09: qty 2

## 2013-08-09 NOTE — ED Provider Notes (Signed)
CSN: [id number]     Arrival date & time 08/09/13  1807 History   First MD Initiated Contact with Patient 08/09/13 1836     Chief Complaint  Patient presents with  . Facial Pain   (Consider location/radiation/quality/duration/timing/severity/associated sxs/prior Treatment) HPI Comments: 27 year old female presents complaining of possible sinus infection that began yesterday. She has nasal congestion, facial pain, sore throat, sinus pressure, and left ear pain. She has a long history of recurrent ear infections incision and she is about to get one. She denies fever, chills, NVD, chest pain, shortness of breath, rash. She's been taking Advil at home with moderate relief of her symptoms.   Past Medical History  Diagnosis Date  . No pertinent past medical history   . Appendicitis     appendectomy 03/20/12   Past Surgical History  Procedure Laterality Date  . Appendectomy  03/20/2012  . Laparoscopic appendectomy  03/20/2012    Procedure: APPENDECTOMY LAPAROSCOPIC;  Surgeon: [name], DO;  Location: [facility] OR;  Service: General;  Laterality: N/A;  Lap. App.   History reviewed. No pertinent family history. History  Substance Use Topics  . Smoking status: Current Every Day Smoker -- 1.00 packs/day    Types: Cigarettes  . Smokeless tobacco: Never Used  . Alcohol Use: Yes     Comment: occ   OB History   Grav Para Term Preterm Abortions TAB SAB Ect Mult Living                 Review of Systems  Constitutional: Negative for fever and chills.  HENT: Positive for ear pain, congestion, sore throat, neck pain and sinus pressure. Negative for rhinorrhea and ear discharge.   Eyes: Negative for visual disturbance.  Respiratory: Positive for cough. Negative for shortness of breath.   Cardiovascular: Negative for chest pain, palpitations and leg swelling.  Gastrointestinal: Negative for nausea, vomiting and abdominal pain.  Endocrine: Negative for polydipsia and polyuria.  Genitourinary:  Negative for dysuria, urgency and frequency.  Musculoskeletal: Negative for myalgias and arthralgias.  Skin: Negative for rash.  Neurological: Negative for dizziness, weakness and light-headedness.    Allergies  Codeine  Home Medications   Current Outpatient Rx  Name  Route  Sig  Dispense  Refill  . amoxicillin (AMOXIL) 875 MG tablet   Oral   Take 1 tablet (875 mg total) by mouth 2 (two) times daily.   14 tablet   0    BP 104/73  Pulse 103  Temp(Src) 98.2 F (36.8 C) (Oral)  Resp 20  SpO2 98%  LMP 07/04/2013 Physical Exam  Nursing note and vitals reviewed. Constitutional: She is oriented to person, place, and time. Vital signs are normal. She appears well-developed and well-nourished. No distress.  HENT:  Head: Normocephalic and atraumatic.  Right Ear: Hearing, external ear and ear canal normal.  Left Ear: Hearing, external ear and ear canal normal. Tympanic membrane is erythematous and bulging. A middle ear effusion (purulent) is present.  Nose: Right sinus exhibits maxillary sinus tenderness and frontal sinus tenderness. Left sinus exhibits maxillary sinus tenderness and frontal sinus tenderness.  Mouth/Throat: Uvula is midline and oropharynx is clear and moist. No oropharyngeal exudate or posterior oropharyngeal erythema.  Bilateral ethmoid sinus tenderness as well  Pulmonary/Chest: Effort normal and breath sounds normal. No respiratory distress. She has no wheezes. She has no rales.  Neurological: She is alert and oriented to person, place, and time. She has normal strength. Coordination normal.  Skin: Skin is warm and dry. No  rash noted. She is not diaphoretic.  Psychiatric: She has a normal mood and affect. Judgment normal.    ED Course  Procedures (including critical care time) Labs Review Labs Reviewed  POCT RAPID STREP A ([facility]RG CARE ONLY)   Imaging Review No results found.  MDM   1. Sinusitis, acute   2. Recurrent AOM (acute otitis media), left      followup if not improving. Continue Advil at home. Also Mucinex D.  Meds ordered this encounter  Medications  . dexamethasone (DECADRON) injection 10 mg    Sig:   . ketorolac (TORADOL) injection 60 mg    Sig:   . amoxicillin (AMOXIL) 875 MG tablet    Sig: Take 1 tablet (875 mg total) by mouth 2 (two) times daily.    Dispense:  14 tablet    Refill:  0     Graylon Good, PA-C 08/09/13 1918

## 2013-08-09 NOTE — ED Notes (Signed)
C/o sinus infection which started yesterday.  OTC medication taken but no relief.   States she does have nasal congestion, sore throat, facial pressure.

## 2013-08-09 NOTE — ED Provider Notes (Signed)
Medical screening examination/treatment/procedure(s) were performed by non-physician practitioner and as supervising physician I was immediately available for consultation/collaboration.  Seeley Southgate, M.D.  Ramatoulaye Pack C Maliaka Brasington, MD 08/09/13 1928 

## 2013-08-12 LAB — CULTURE, GROUP A STREP

## 2013-08-13 NOTE — ED Notes (Signed)
Final report negative for group A step and negative for group B hemolytic step. No further action required

## 2013-08-13 NOTE — ED Notes (Signed)
Lab review

## 2014-08-04 ENCOUNTER — Emergency Department (HOSPITAL_COMMUNITY)
Admission: EM | Admit: 2014-08-04 | Discharge: 2014-08-04 | Disposition: A | Discharge status: Home / Self Care | Payer: Self-pay | Attending: Emergency Medicine | Admitting: Emergency Medicine

## 2014-08-04 ENCOUNTER — Encounter (HOSPITAL_COMMUNITY): Payer: Self-pay | Admitting: Emergency Medicine

## 2014-08-04 DIAGNOSIS — F17200 Nicotine dependence, unspecified, uncomplicated: Secondary | ICD-10-CM | POA: Insufficient documentation

## 2014-08-04 DIAGNOSIS — Z3202 Encounter for pregnancy test, result negative: Secondary | ICD-10-CM | POA: Insufficient documentation

## 2014-08-04 DIAGNOSIS — A499 Bacterial infection, unspecified: Secondary | ICD-10-CM | POA: Insufficient documentation

## 2014-08-04 DIAGNOSIS — B9689 Other specified bacterial agents as the cause of diseases classified elsewhere: Secondary | ICD-10-CM | POA: Insufficient documentation

## 2014-08-04 DIAGNOSIS — N898 Other specified noninflammatory disorders of vagina: Secondary | ICD-10-CM | POA: Insufficient documentation

## 2014-08-04 DIAGNOSIS — R319 Hematuria, unspecified: Secondary | ICD-10-CM | POA: Insufficient documentation

## 2014-08-04 DIAGNOSIS — R300 Dysuria: Secondary | ICD-10-CM | POA: Insufficient documentation

## 2014-08-04 DIAGNOSIS — Z8719 Personal history of other diseases of the digestive system: Secondary | ICD-10-CM | POA: Insufficient documentation

## 2014-08-04 DIAGNOSIS — N760 Acute vaginitis: Secondary | ICD-10-CM | POA: Insufficient documentation

## 2014-08-04 LAB — URINE MICROSCOPIC-ADD ON

## 2014-08-04 LAB — I-STAT CHEM 8, ED
BUN: 8 mg/dL (ref 6–23)
CHLORIDE: 107 meq/L (ref 96–112)
CREATININE: 0.8 mg/dL (ref 0.50–1.10)
Calcium, Ion: 1.19 mmol/L (ref 1.12–1.23)
Glucose, Bld: 97 mg/dL (ref 70–99)
HCT: 38 % (ref 36.0–46.0)
Hemoglobin: 12.9 g/dL (ref 12.0–15.0)
POTASSIUM: 3.9 meq/L (ref 3.7–5.3)
SODIUM: 139 meq/L (ref 137–147)
TCO2: 23 mmol/L (ref 0–100)

## 2014-08-04 LAB — CBC WITH DIFFERENTIAL/PLATELET
BASOS ABS: 0 10*3/uL (ref 0.0–0.1)
Basophils Relative: 0 % (ref 0–1)
Eosinophils Absolute: 0.1 10*3/uL (ref 0.0–0.7)
Eosinophils Relative: 1 % (ref 0–5)
HCT: 36.5 % (ref 36.0–46.0)
Hemoglobin: 12.2 g/dL (ref 12.0–15.0)
LYMPHS ABS: 3.6 10*3/uL (ref 0.7–4.0)
LYMPHS PCT: 34 % (ref 12–46)
MCH: 26.8 pg (ref 26.0–34.0)
MCHC: 33.4 g/dL (ref 30.0–36.0)
MCV: 80.2 fL (ref 78.0–100.0)
Monocytes Absolute: 0.9 10*3/uL (ref 0.1–1.0)
Monocytes Relative: 8 % (ref 3–12)
NEUTROS ABS: 6.1 10*3/uL (ref 1.7–7.7)
NEUTROS PCT: 57 % (ref 43–77)
PLATELETS: 241 10*3/uL (ref 150–400)
RBC: 4.55 MIL/uL (ref 3.87–5.11)
RDW: 13.5 % (ref 11.5–15.5)
WBC: 10.6 10*3/uL — AB (ref 4.0–10.5)

## 2014-08-04 LAB — URINALYSIS, ROUTINE W REFLEX MICROSCOPIC
Bilirubin Urine: NEGATIVE
GLUCOSE, UA: NEGATIVE mg/dL
Ketones, ur: 15 mg/dL — AB
NITRITE: NEGATIVE
PH: 6.5 (ref 5.0–8.0)
Protein, ur: >300 mg/dL — AB
Specific Gravity, Urine: 1.025 (ref 1.005–1.030)
Urobilinogen, UA: 4 mg/dL — ABNORMAL HIGH (ref 0.0–1.0)

## 2014-08-04 LAB — WET PREP, GENITAL
TRICH WET PREP: NONE SEEN
YEAST WET PREP: NONE SEEN

## 2014-08-04 LAB — PREGNANCY, URINE: PREG TEST UR: NEGATIVE

## 2014-08-04 LAB — RPR

## 2014-08-04 LAB — FERRITIN: FERRITIN: 34 ng/mL (ref 10–291)

## 2014-08-04 LAB — HIV ANTIBODY (ROUTINE TESTING W REFLEX): HIV: NONREACTIVE

## 2014-08-04 MED ORDER — NAPROXEN 250 MG PO TABS: 500.0000 mg | ORAL_TABLET | Freq: Two times a day (BID) | ORAL | Status: DC | PRN

## 2014-08-04 MED ORDER — FOLIC ACID 1 MG PO TABS: 1.0000 mg | ORAL_TABLET | Freq: Every day | ORAL | Status: DC

## 2014-08-04 MED ORDER — VITAMIN B-1 100 MG PO TABS: 100.0000 mg | ORAL_TABLET | Freq: Every day | ORAL | Status: DC

## 2014-08-04 MED ORDER — METHOCARBAMOL 500 MG PO TABS: 500.0000 mg | ORAL_TABLET | Freq: Three times a day (TID) | ORAL | Status: DC | PRN

## 2014-08-04 MED ORDER — LOPERAMIDE HCL 2 MG PO CAPS: 2.0000 mg | ORAL_CAPSULE | ORAL | Status: DC | PRN

## 2014-08-04 MED ORDER — LORAZEPAM 2 MG/ML IJ SOLN: 1.0000 mg | Freq: Four times a day (QID) | INTRAMUSCULAR | Status: DC | PRN

## 2014-08-04 MED ORDER — DICYCLOMINE HCL 20 MG PO TABS: 20.0000 mg | ORAL_TABLET | Freq: Four times a day (QID) | ORAL | Status: DC | PRN

## 2014-08-04 MED ORDER — THIAMINE HCL 100 MG/ML IJ SOLN: 100.0000 mg | Freq: Every day | INTRAMUSCULAR | Status: DC

## 2014-08-04 MED ORDER — METRONIDAZOLE 500 MG PO TABS: 500.0000 mg | ORAL_TABLET | Freq: Two times a day (BID) | ORAL | Status: DC

## 2014-08-04 MED ORDER — ONDANSETRON 4 MG PO TBDP: 4.0000 mg | ORAL_TABLET | Freq: Four times a day (QID) | ORAL | Status: DC | PRN

## 2014-08-04 MED ORDER — HYDROXYZINE HCL 25 MG PO TABS: 25.0000 mg | ORAL_TABLET | Freq: Four times a day (QID) | ORAL | Status: DC | PRN

## 2014-08-04 MED ORDER — LORAZEPAM 1 MG PO TABS: 1.0000 mg | ORAL_TABLET | Freq: Four times a day (QID) | ORAL | Status: DC | PRN

## 2014-08-04 MED ORDER — ADULT MULTIVITAMIN W/MINERALS CH: 1.0000 | ORAL_TABLET | Freq: Every day | ORAL | Status: DC

## 2014-08-04 MED ORDER — FERROUS SULFATE 325 (65 FE) MG PO TABS: 325.0000 mg | ORAL_TABLET | Freq: Every day | ORAL | Status: DC

## 2014-08-04 NOTE — Discharge Instructions (Signed)
Return to the emergency room with worsening of symptoms, new symptoms or with symptoms that are concerning, especially return of hematuria and clots, lightheadedness or dizziness.  Please call your doctor for a followup appointment within 24-48 hours. When you talk to your doctor please let them know that you were seen in the emergency department and have them acquire all of your records so that they can discuss the findings with you and formulate a treatment plan to fully care for your new and ongoing problems. If you do not have a primary care provider call the below number under the ED resource guide to establish care and follow up. Call below urology number and schedule a follow up for further evaluation of blood in your urine. Continue to take Azo Please take all of your antibiotics until finished!   You may develop abdominal discomfort or diarrhea from the antibiotic.  You may help offset this with probiotics which you can buy or get in yogurt. Do not eat  or take the probiotics until 2 hours after your antibiotic.    Emergency Department Resource Guide 1) Find a Doctor and Pay Out of Pocket Although you won't have to find out who is covered by your insurance plan, it is a good idea to ask around and get recommendations. You will then need to call the office and see if the doctor you have chosen will accept you as a new patient and what types of options they offer for patients who are self-pay. Some doctors offer discounts or will set up payment plans for their patients who do not have insurance, but you will need to ask so you aren't surprised when you get to your appointment.  2) Contact Your Local Health Department Not all health departments have doctors that can see patients for sick visits, but many do, so it is worth a call to see if yours does. If you don't know where your local health department is, you can check in your phone book. The CDC also has a tool to help you locate your state's  health department, and many state websites also have listings of all of their local health departments.  3) Find a Walk-in Clinic If your illness is not likely to be very severe or complicated, you may want to try a walk in clinic. These are popping up all over the country in pharmacies, drugstores, and shopping centers. They're usually staffed by nurse practitioners or physician assistants that have been trained to treat common illnesses and complaints. They're usually fairly quick and inexpensive. However, if you have serious medical issues or chronic medical problems, these are probably not your best option.  No Primary Care Doctor: - Call Health Connect at  [phone] - they can help you locate a primary care doctor that  accepts your insurance, provides certain services, etc. - Physician Referral Service- [phone]  Chronic Pain Problems: Organization         Address  Phone   Notes  [facility]  [phone] Patients need to be referred by their primary care doctor.   Medication Assistance: Organization         Address  Phone   Notes  [facility] Medication [facility] [address] [place] [place], [place] [address] [phone] --Must be a resident of [facility] -- Must have NO insurance coverage whatsoever (no Medicaid/ Medicare, etc.) -- The pt. MUST have a primary care doctor that directs their care regularly and follows  them in the community   Med[facility]ist  [phone]   [employer]  [phone]    Agencies that provide inexpensive medical care: Organization         Address  Phone   Notes  [facility]  [phone]   [facility]    [phone]   [facility] [address] [place], [place] [address] [phone]   [facility] [address] [place], [place] [phone]   [facility]anned Parenthood    [phone]   [facility]    [phone]    [facility]  [address], [place] Phone:  [phone], Fax:  [phone] Hours of Operation:  9 am - 6 pm, M-F.  Also accepts Medicaid/Medicare and self-pay.  [facility]  [address], [place] Phone: [phone], Fax: [phone]. Hours of Operation:  8:30 am - 5:30 pm, M-F.  Also accepts Medicaid and self-pay.  [facility] [place] [address], [place] Phone: [phone]   [facility] [address] Natasha Bence [place], [place] [phone], Ext. [phone] Mondays & Thursdays: 7-9 AM.  First 15 patients are seen on a first come, first serve basis.    Medicaid-accepting [facility] Providers:  Organization         Address  Phone   Notes  [facility] [address], [place] [phone] Also accepts self-pay patients.  [facility] [address] Laurell Josephs [place], [place]  [phone]   [facility] [address], [place] [phone]   [facility] [address] [place] [phone]   Renaye Rakers [address], [place]   [phone] Only accepts [place] Access [place] patients after they have their name applied to their card.   Self-Pay (no insurance) in [facility]:  Organization         Address  Phone   Notes  Sickle Cell Patients, [facility] Internal Medicine [address] [place], [place] [phone]   [facility] [address] [place], [place] [phone]   [facility] [place]  [address], [place] [phone]   [facility]/Dr. Osei-Bonsu  [address] [place] or [address], [place] [phone] Phone number for both [place] and [place] locations is the same.  [facility] [address] [place] [phone]     [facility] [address] [place] or [address] [phone]   [facility] [address] [place] [phone], phone; [phone], fax Sees patients 1st and 3rd Saturday of every month.  Must not qualify for public or private insurance (i.e. Medicaid, Medicare, [place] Health Choice, Veterans' Benefits)  Household income should be no more than 200% of the poverty level The clinic cannot treat you if you are pregnant or think you are pregnant  Sexually transmitted diseases are not treated at the clinic.    Dental Care: Organization         Address  Phone  Notes  [facility] Department of [facility] [address] [place], [place] [phone] Accepts children up to age 32 who are  enrolled in Medicaid or [place] Health Choice; pregnant women with a Medicaid card; and children who have applied for Medicaid or [place] Health Choice, but were declined, whose parents can pay a reduced fee at time of service.  [facility] Department of [facility]  [address], [place] [phone] Accepts children up to age 16 who are enrolled in [place] or [place] Health Choice; pregnant women with a Medicaid card; and children who have applied for Medicaid or [place] Health Choice, but were declined, whose parents can pay a reduced fee at time of service.  [facility]  [address] [place], [place] [phone] Patients are seen by appointment only. Walk-ins are not accepted. [facility] will see patients 36 years of age and older. Monday - Tuesday (8am-5pm) Most Wednesdays (8:30-5pm) $30 per visit, cash only  [facility] Adult Dental Access [facility]  [address], [facility] [phone] Patients are seen by appointment only. Walk-ins are not accepted. [facility] will see patients 73 years of age and older. One Wednesday Evening (Monthly: Volunteer Based).   $30 per visit, cash only  [employer]  [phone] for adults; Children under age 24, call Graduate Pediatric Dentistry at [phone]. Children aged 80-14, please call [phone] to request a pediatric application.  Dental services are provided in all areas of dental care including fillings, crowns and bridges, complete and partial dentures, implants, gum treatment, root canals, and extractions. Preventive care is also provided. Treatment is provided to both adults and children. Patients are selected via a lottery and there is often a waiting list.   [facility] [address] [place]  [phone] [email or web address]   [facility] [address] [place], [place] [phone], Ext. [phone] Second and Fourth Thursday of each month, opens at 6:30 AM; Clinic ends at 9 AM.  Patients are seen on a first-come first-served basis, and a limited number are seen during each clinic.   [facility]  [address] [place], [place] [phone]   Eligibility Requirements You must have lived in [place], [place], or [place] counties for at least the last three months.   You cannot be eligible for state or federal sponsored [place], including [employer], [place], or [employer].   You generally cannot be eligible for healthcare insurance through your employer.    How to apply: Eligibility screenings are held every Tuesday and Wednesday afternoon from 1:00 pm until 4:00 pm. You do not need an appointment for the interview!  [facility] [address] [place], [place] [phone]   [facility] Health Department  [phone]   [facility] Health Department  [phone]   [facility] Health Department  [phone]    Behavioral Health Resources in the Community: Intensive Outpatient Programs Organization         Address  Phone  Notes  [facility] Services [address] [place], [place] [phone]   [facility] Outpatient [address], [place], [place] [phone]   [facility] [address], [place], [place]  [phone]   [facility] Mental Health [address],  [place], [place] [phone] or [phone]   Substance Abuse Resources Organization         Address  Phone  Notes  [facility]  [phone]   [facility]  [phone]  [facility]  [phone]   Floydene Flock  [phone]   Residential & Outpatient Substance Abuse Program  [phone]   Psychological Services Organization         Address  Phone  Notes  [facility] Behavioral Health  [phone]   [facility]  [phone]   [facility] Mental Health [address] [place] [phone] or [phone]    Mobile Crisis Teams Organization         Address  Phone  Notes  [facility], Mobile Crisis Care Unit  [phone]   [facility]  [address] [place], [place] [phone]   Doristine Locks [address] [place] [phone]    Self-Help/Support Groups Organization         Address  Phone             Notes  [facility] [place] - variety of support groups  336- I7437963 Call for more information  Narcotics Anonymous (NA), [facility] [address], [employer] [place]  2 meetings at this location   Statistician         Address  Phone  Notes  [facility] [address],    [place]  [phone]   [facility]  [address], [place] [address], [place], [place] [phone]   [facility] [address] [place], [place] [phone] Admissions: 8am-3pm M-F  [facility] [address]    [place], [place] [phone]   [facility] [address] [place], [place], [place] [phone]   [facility] [address],  [place], [place] [phone]   [facility] - Intensive Outpatient [address] Laurell Josephs [address], [place], [place] [phone]   [facility] ([facility]iction [facility] [address] [place]  [place], [place] [phone] or [phone]   [facility] ([facility]) [address] [place], [place] [phone] Accepts Medicaid  [facility] [address],  [place] [phone] Substance Abuse/Addiction Treatment   [facility] Organization         Address  Phone  Notes  [facility]  [phone]   Angie Fava, PhD [address] Ervin Knack [place], [place]   [phone]) [phone] or [phone]   [facility]   [address] [place], [place] [phone]   [facility] [address] [place], [place] [phone] Insurance/Medicaid/sponsorship through [facility] and Families [address]                                    [place], [place] [phone] Therapy/tele-psych/case  [facility] [address] [place], [place] [phone]    Dr. Lolly Mustache  [phone]   [facility]  [employer] [facility] Dept. 1) [address], [place] 2) [address] [place] 3)  [address], [place] [phone]  [phone]   [facility] Child Abuse Hotline (914)153-8595 or [phone] (After Hours)

## 2014-08-04 NOTE — ED Notes (Signed)
The pt has had pressure in her bladder all day but tonight she has had grossly bloody urine with increasing pain   Now.  lmp 2 months ago

## 2014-08-04 NOTE — ED Provider Notes (Signed)
Patient reports bleeding hematuria for the past 2 days, accompanied by dysuria with burning at urethral meatus. She denies nausea denies vomiting denies fever denies lightheadedness denies other associated symptoms. Patient presently resting comfortably.  Doug Sou, MD 08/04/14 [id number]

## 2014-08-04 NOTE — ED Provider Notes (Signed)
CSN: [id number]     Arrival date & time 08/04/14  0255 History   First MD Initiated Contact with Patient 08/04/14 [id number]     Chief Complaint  Patient presents with  . Hematuria     HPI Cindy Livingston is a 28 y.o. female with PMH of appendectomy presenting with suprapubic abdominal pressure, dysuria, frequency all night and then gross blood urine with 3-4 clots in urine x 1 day and currently. Patient notes not having intercourse for over a year and then recently in the last week having intercourse around 3 times. Patient denies nausea, vomiting, other abdominal pain or back pain. Patient reports increased vaginal discharge and no pain with sex. No foul odor. Patient afebrile. No hematochezia or melena. No hemoptysis. LMP 2 months ago.   Past Medical History  Diagnosis Date  . No pertinent past medical history   . Appendicitis     appendectomy 03/20/12   Past Surgical History  Procedure Laterality Date  . Appendectomy  03/20/2012  . Laparoscopic appendectomy  03/20/2012    Procedure: APPENDECTOMY LAPAROSCOPIC;  Surgeon: [name], DO;  Location: [facility];  Service: General;  Laterality: N/A;  Lap. App.   No family history on file. History  Substance Use Topics  . Smoking status: Current Every Day Smoker -- 1.00 packs/day    Types: Cigarettes  . Smokeless tobacco: Never Used  . Alcohol Use: Yes     Comment: occ   OB History   Grav Para Term Preterm Abortions TAB SAB Ect Mult Living                 Review of Systems  Constitutional: Negative for fever and chills.  HENT: Negative for congestion and rhinorrhea.   Eyes: Negative for visual disturbance.  Respiratory: Negative for cough and shortness of breath.   Cardiovascular: Negative for chest pain and palpitations.  Gastrointestinal: Negative for nausea, vomiting and diarrhea.  Genitourinary: Positive for dysuria, hematuria and vaginal discharge.  Musculoskeletal: Negative for back pain and gait problem.  Skin: Negative for  rash.  Neurological: Negative for weakness and headaches.      Allergies  Codeine  Home Medications   Prior to Admission medications   Medication Sig Start Date End Date Taking? Authorizing Provider  Phenazopyridine HCl (AZO TABS PO) Take 1 tablet by mouth 2 (two) times daily as needed (urinary pain).   Yes Historical Provider, MD  metroNIDAZOLE (FLAGYL) 500 MG tablet Take 1 tablet (500 mg total) by mouth 2 (two) times daily. 08/04/14   Benetta Spar L Brittay Mogle, PA-C   BP 92/52  Pulse 90  Temp(Src) 98 F (36.7 C)  Resp 18  SpO2 99%  LMP 06/03/2014 Physical Exam  Nursing note and vitals reviewed. Constitutional: She appears well-developed and well-nourished. No distress.  HENT:  Head: Normocephalic and atraumatic.  Eyes: Conjunctivae and EOM are normal. Right eye exhibits no discharge. Left eye exhibits no discharge. No scleral icterus.  Cardiovascular: Normal rate, regular rhythm and normal heart sounds.   Pulmonary/Chest: Effort normal and breath sounds normal. No respiratory distress. She has no wheezes.  Abdominal: Soft. Bowel sounds are normal. She exhibits no distension. There is no tenderness.  Mild suprapubic tenderness without rebound or guarding. +BS.  Genitourinary:  Cervix pink without lesions. Os closed. No CMT no adnexal tenderness. Minimal discharge white opaque without foul odor. Nursing tech in room for exam.   Musculoskeletal: Normal range of motion. She exhibits no tenderness.  Neurological: She is alert. She exhibits normal  muscle tone. Coordination normal.  Skin: Skin is warm and dry. She is not diaphoretic.  Psychiatric: She has a normal mood and affect. Her behavior is normal.    ED Course  Procedures (including critical care time) Labs Review Labs Reviewed  WET PREP, GENITAL - Abnormal; Notable for the following:    Clue Cells Wet Prep HPF POC MODERATE (*)    WBC, Wet Prep HPF POC MODERATE (*)    All other components within normal limits  URINALYSIS,  ROUTINE W REFLEX MICROSCOPIC - Abnormal; Notable for the following:    Color, Urine RED (*)    APPearance TURBID (*)    Hgb urine dipstick LARGE (*)    Ketones, ur 15 (*)    Protein, ur >300 (*)    Urobilinogen, UA 4.0 (*)    Leukocytes, UA TRACE (*)    All other components within normal limits  URINE MICROSCOPIC-ADD ON - Abnormal; Notable for the following:    Bacteria, UA FEW (*)    All other components within normal limits  CBC WITH DIFFERENTIAL - Abnormal; Notable for the following:    WBC 10.6 (*)    All other components within normal limits  URINE CULTURE  GC/CHLAMYDIA PROBE AMP  PREGNANCY, URINE  RPR  HIV ANTIBODY (ROUTINE TESTING)  FERRITIN  I-STAT CHEM 8, ED    Imaging Review No results found.   EKG Interpretation None     Meds given in ED:  Medications - No data to display  Discharge Medication List as of 08/04/2014  8:59 AM    START taking these medications   Details  metroNIDAZOLE (FLAGYL) 500 MG tablet Take 1 tablet (500 mg total) by mouth 2 (two) times daily., Starting 08/04/2014, Until Discontinued, Print          MDM   Final diagnoses:  Hematuria  Bacterial vaginosis   Patient with clue cells and fishy odor and moderate discharge, cervix normal. Pt diagnosed with BV. Patient given flagyl and instructed to not drink alcohol while taking it. Patient also tested for other STDs. Wet prep with moderate WBC, patient without abdominal pain, CMT or adnexal tenderness and cervix normal. I doubt PID. Recommend follow up with PCP and establishing care. Resources provided. Patient with symptoms of dysuria and hematura without strong signs for UTI on UA. Will not treat for UTI today. Kidney function WNL. Follow up with urology due to extent of hematuria with clots. Patient not bleeding in ED. Patient not anemic and without lightheadedness or dizziness.   Discussed return precautions with patient. Discussed all results and patient verbalizes understanding and  agrees with plan.  This is a shared patient. This patient was discussed with the physician, Dr. Ethelda Chick who saw and evaluated the patient.      Louann Sjogren, PA-C 08/04/14 1642

## 2014-08-04 NOTE — ED Provider Notes (Signed)
Medical screening examination/treatment/procedure(s) were conducted as a shared visit with non-physician practitioner(s) and myself.  I personally evaluated the patient during the encounter.   EKG Interpretation None       Emmaly Leech, MD 08/04/14 1653 

## 2014-08-04 NOTE — ED Notes (Signed)
Pt reports that she began having UTI type symptoms yesterday that included bladder pressure, burning urination, and frequency.  Pt reports she took some OTC UTI meds.  Later in the night she began urinating blood including blood clots.  Pt reports she has not had sexual relations in a year.  Pt reports she recently began having intercourse.  Pt is concerned that it is related.

## 2014-08-05 LAB — GC/CHLAMYDIA PROBE AMP
CT PROBE, AMP APTIMA: NEGATIVE
GC Probe RNA: NEGATIVE

## 2014-08-07 LAB — URINE CULTURE

## 2014-08-08 ENCOUNTER — Telehealth (HOSPITAL_COMMUNITY): Payer: Self-pay

## 2014-08-08 NOTE — ED Notes (Signed)
Post ED Visit - Positive Culture Follow-up  Culture report reviewed by antimicrobial stewardship pharmacist:  Wes Dulaney, Pharm.D., BCPS  Celedonio Miyamoto, Pharm.D., BCPS  Georgina Pillion, Pharm.D., BCPS  [place], [address].D., BCPS, AAHIVP  Estella Husk, Pharm.D., BCPS, AAHIVP  Carly Sabat, Pharm.D.  [place]zo Bi, [address].D.  Positive urine culture Treated withmetronidazole, organism sensitive to the same and no further patient follow-up is required at this time.  Ashley Jacobs 08/08/2014, 10:12 AM

## 2014-08-10 ENCOUNTER — Inpatient Hospital Stay (HOSPITAL_COMMUNITY)
Admission: AD | Admit: 2014-08-10 | Discharge: 2014-08-11 | Disposition: A | Discharge status: Home / Self Care | Payer: Self-pay | Source: Ambulatory Visit | Attending: Obstetrics & Gynecology | Admitting: Obstetrics & Gynecology

## 2014-08-10 DIAGNOSIS — N390 Urinary tract infection, site not specified: Secondary | ICD-10-CM | POA: Insufficient documentation

## 2014-08-10 DIAGNOSIS — F17200 Nicotine dependence, unspecified, uncomplicated: Secondary | ICD-10-CM | POA: Insufficient documentation

## 2014-08-10 HISTORY — DX: Irritable bowel syndrome, unspecified: K58.9

## 2014-08-10 HISTORY — DX: Anxiety disorder, unspecified: F41.9

## 2014-08-10 LAB — POCT PREGNANCY, URINE: Preg Test, Ur: NEGATIVE

## 2014-08-10 NOTE — MAU Note (Signed)
Pt reports she was seen last weekend due to blood in her urine. Was given antibiotics and it seemed to get better but now she is having frequency, lower abd pressure and vaginal itching.

## 2014-08-11 ENCOUNTER — Encounter (HOSPITAL_COMMUNITY): Payer: Self-pay | Admitting: *Deleted

## 2014-08-11 DIAGNOSIS — N390 Urinary tract infection, site not specified: Secondary | ICD-10-CM

## 2014-08-11 LAB — URINE MICROSCOPIC-ADD ON

## 2014-08-11 LAB — URINALYSIS, ROUTINE W REFLEX MICROSCOPIC
Bilirubin Urine: NEGATIVE
Glucose, UA: NEGATIVE mg/dL
KETONES UR: NEGATIVE mg/dL
Nitrite: POSITIVE — AB
PROTEIN: 100 mg/dL — AB
Specific Gravity, Urine: 1.025 (ref 1.005–1.030)
UROBILINOGEN UA: 1 mg/dL (ref 0.0–1.0)
pH: 6 (ref 5.0–8.0)

## 2014-08-11 MED ORDER — CIPROFLOXACIN HCL 500 MG PO TABS: 500.0000 mg | ORAL_TABLET | Freq: Two times a day (BID) | ORAL | Status: DC

## 2014-08-11 MED ORDER — PHENAZOPYRIDINE HCL 100 MG PO TABS: 100.0000 mg | ORAL_TABLET | Freq: Three times a day (TID) | ORAL | Status: DC | PRN

## 2014-08-11 NOTE — MAU Provider Note (Signed)
History     CSN: [id number]  Arrival date and time: 08/10/14 2333   None     Chief Complaint  Patient presents with  . Urinary Frequency  . Abdominal Pain   HPI This is a 28 y.o. female who presents with c/o urinary frequency, bladder pain and pressure, and some vaginal itching. Seen on 08/04/14 in ED and treated for BV with metronidazole. There is a note from 08/08/14 stating culture grew E Coli and that it was "sensitive to Metronidazole" with no further treatment needed.   RN Note: Pt reports she was seen last weekend due to blood in her urine. Was given antibiotics and it seemed to get better but now she is having frequency, lower abd pressure and vaginal itching.        OB History   Grav Para Term Preterm Abortions TAB SAB Ect Mult Living   Past Medical History  Diagnosis Date  . No pertinent past medical history   . Appendicitis     appendectomy 03/20/12  . IBS (irritable bowel syndrome)   . Anxiety     Past Surgical History  Procedure Laterality Date  . Appendectomy  03/20/2012  . Laparoscopic appendectomy  03/20/2012    Procedure: APPENDECTOMY LAPAROSCOPIC;  Surgeon: [name], DO;  Location: [facility] OR;  Service: General;  Laterality: N/A;  Lap. App.  . Cesarean section    . Tympanoplasty      History reviewed. No pertinent family history.  History  Substance Use Topics  . Smoking status: Current Every Day Smoker -- 1.00 packs/day    Types: Cigarettes  . Smokeless tobacco: Never Used  . Alcohol Use: Yes     Comment: occ    Allergies:  Allergies  Allergen Reactions  . Codeine Rash    Prescriptions prior to admission  Medication Sig Dispense Refill  . metroNIDAZOLE (FLAGYL) 500 MG tablet Take 1 tablet (500 mg total) by mouth 2 (two) times daily.  14 tablet  0  . Phenazopyridine HCl (AZO TABS PO) Take 1 tablet by mouth 2 (two) times daily as needed (urinary pain).        Review of Systems  Constitutional: Negative for fever  and chills.  Gastrointestinal: Positive for abdominal pain. Negative for nausea, vomiting, diarrhea and constipation.  Musculoskeletal: Positive for back pain (low back).   Physical Exam   Blood pressure 111/73, pulse 80, temperature 98.1 F (36.7 C), temperature source Oral, resp. rate 16, height  (1.753 m), weight 159 lb (72.122 kg), last menstrual period 08/04/2014, SpO2 100.00%.  Physical Exam  Constitutional: She is oriented to person, place, and time. She appears well-developed and well-nourished. No distress.  Cardiovascular: Normal rate.   Respiratory: Effort normal.  GI: Soft. She exhibits no distension and no mass. There is tenderness (suprapubic). There is no rebound and no guarding.  Genitourinary: Uterus normal. Vaginal discharge (thin white, no erethema, has used monistat) found.  Musculoskeletal: Normal range of motion.  Neurological: She is alert and oriented to person, place, and time.  Skin: Skin is warm and dry.  Psychiatric: She has a normal mood and affect.    MAU Course  Procedures Results for Rahrig, RAKEB KIBBLE (MRN [id number]) as of 08/11/2014 00:43  Ref. Range 08/04/2014 03:11  Organism ID, Bacteria No range found ESCHERICHIA COLI  AMPICILLIN >=32 RESISTANT R Final CEFAZOLIN <=4 SENSITIVE S Final CEFTRIAXONE <=1 SENSITIVE S Final CIPROFLOXACIN <=0.25  SENSITIVE S Final GENTAMICIN <=1 SENSITIVE S Final LEVOFLOXACIN <=0.12 SENSITIVE S Final NITROFURANTOIN <=16 SENSITIVE S Final PIP/TAZO <=4 SENSITIVE S Final TOBRAMYCIN <=1 SENSITIVE S Final TRIMETH/SULFA >=320 RESISTANT R Final  Assessment and Plan  A:  UTI       Possible yeast, already treated  P:  Discussed culture       Rx Cipro and pyridium       Fluids       Follow up as needed  [facility] 08/11/2014, 12:50 AM

## 2014-08-11 NOTE — Discharge Instructions (Signed)

## 2014-09-23 ENCOUNTER — Encounter (HOSPITAL_COMMUNITY): Payer: Self-pay | Admitting: *Deleted

## 2014-11-28 ENCOUNTER — Emergency Department (INDEPENDENT_AMBULATORY_CARE_PROVIDER_SITE_OTHER)
Admission: EM | Admit: 2014-11-28 | Discharge: 2014-11-28 | Disposition: A | Discharge status: Home / Self Care | Payer: Self-pay | Source: Home / Self Care | Attending: Family Medicine | Admitting: Family Medicine

## 2014-11-28 ENCOUNTER — Encounter (HOSPITAL_COMMUNITY): Payer: Self-pay | Admitting: *Deleted

## 2014-11-28 DIAGNOSIS — B349 Viral infection, unspecified: Secondary | ICD-10-CM

## 2014-11-28 DIAGNOSIS — J069 Acute upper respiratory infection, unspecified: Secondary | ICD-10-CM

## 2014-11-28 LAB — POCT RAPID STREP A: Streptococcus, Group A Screen (Direct): NEGATIVE

## 2014-11-28 NOTE — ED Provider Notes (Signed)
CSN: [id number]     Arrival date & time 11/28/14  1603 History   First MD Initiated Contact with Patient 11/28/14 1712     Chief Complaint  Patient presents with  . Sore Throat   (Consider location/radiation/quality/duration/timing/severity/associated sxs/prior Treatment) HPI Comments: 29 year old female complaining of body aches for 24 hours. Other complaints include facial pressure, nasal congestion recent fever, possible sore throat comes and goes, nasal congestion and runny nose. Denies earache. Denies GI symptoms and has not had a flu shot this year.   Past Medical History  Diagnosis Date  . No pertinent past medical history   . Appendicitis     appendectomy 03/20/12  . IBS (irritable bowel syndrome)   . Anxiety    Past Surgical History  Procedure Laterality Date  . Appendectomy  03/20/2012  . Laparoscopic appendectomy  03/20/2012    Procedure: APPENDECTOMY LAPAROSCOPIC;  Surgeon: [name], DO;  Location: [facility] OR;  Service: General;  Laterality: N/A;  Lap. App.  . Cesarean section  2004, 2007    x 2   . Tympanoplasty Left 1992 and 1995   History reviewed. No pertinent family history. History  Substance Use Topics  . Smoking status: Current Every Day Smoker -- 1.00 packs/day    Types: Cigarettes  . Smokeless tobacco: Never Used  . Alcohol Use: Yes     Comment: occ   OB History    Gravida Para Term Preterm AB TAB SAB Ectopic Multiple Living   Review of Systems  Constitutional: Positive for fever, activity change, appetite change and fatigue.  HENT: Positive for congestion, postnasal drip, rhinorrhea and sore throat. Negative for ear pain.   Eyes: Negative.   Respiratory: Positive for cough. Negative for shortness of breath.   Cardiovascular: Negative.   Gastrointestinal: Negative.   Neurological: Negative.     Allergies  Codeine  Home Medications   Prior to Admission medications   Medication Sig Start Date End Date Taking? Authorizing  Provider  ciprofloxacin (CIPRO) 500 MG tablet Take 1 tablet (500 mg total) by mouth 2 (two) times daily. 08/11/14   Aviva Signs, CNM  metroNIDAZOLE (FLAGYL) 500 MG tablet Take 1 tablet (500 mg total) by mouth 2 (two) times daily. 08/04/14   Louann Sjogren, PA-C  phenazopyridine (PYRIDIUM) 100 MG tablet Take 1 tablet (100 mg total) by mouth 3 (three) times daily as needed for pain. 08/11/14   Aviva Signs, CNM   BP 106/67 mmHg  Pulse 106  Temp(Src) 98.6 F (37 C) (Oral)  Resp 16  SpO2 100%  LMP 11/21/2014 Physical Exam  Constitutional: She is oriented to person, place, and time. She appears well-developed and well-nourished. No distress.  HENT:  Left TM distorted due to previous surgeries. Right TM is normal. Oropharynx with erythema slightly enlarged tonsils and clear PND. No exudates  Eyes: Conjunctivae and EOM are normal.  Neck: Normal range of motion. Neck supple.  Cardiovascular: Normal rate, regular rhythm and normal heart sounds.   Pulmonary/Chest: Effort normal and breath sounds normal. No respiratory distress. She has no wheezes. She has no rales.  Lymphadenopathy:    She has no cervical adenopathy.  Neurological: She is alert and oriented to person, place, and time.  Skin: Skin is warm and dry.  Nursing note and vitals reviewed.   ED Course  Procedures (including critical care time) Labs Review Labs Reviewed  POCT RAPID STREP A ([facility] ONLY)   Results for orders placed or performed during the hospital encounter of 11/28/14  POCT rapid strep A [facility])  Result Value Ref Range   Streptococcus, Group A Screen (Direct) NEGATIVE NEGATIVE    Imaging Review No results found.   MDM   1. Viral syndrome   2. URI (upper respiratory infection)    OTC meds Fluids Rest ibuprofen     Hayden Rasmussenavid Kathlen Sakurai, NP 11/28/14 1744

## 2014-11-28 NOTE — ED Notes (Signed)
C/o sore throat onset last night.  Daughter has had a fever 2 days ago but no sore throat.  C/o fever but does not have thermometer since last night.  C/o pressure in roof of mouth and behind her nose and eyes. C/o runny nose and occasional sneezing and cough.

## 2014-11-28 NOTE — Discharge Instructions (Signed)
Viral Infections A viral infection can be caused by different types of viruses.Most viral infections are not serious and resolve on their own. However, some infections may cause severe symptoms and may lead to further complications. SYMPTOMS Viruses can frequently cause:  Minor sore throat.  Aches and pains.  Headaches.  Runny nose.  Different types of rashes.  Watery eyes.  Tiredness.  Cough.  Loss of appetite.  Gastrointestinal infections, resulting in nausea, vomiting, and diarrhea. These symptoms do not respond to antibiotics because the infection is not caused by bacteria. However, you might catch a bacterial infection following the viral infection. This is sometimes called a "superinfection." Symptoms of such a bacterial infection may include:  Worsening sore throat with pus and difficulty swallowing.  Swollen neck glands.  Chills and a high or persistent fever.  Severe headache.  Tenderness over the sinuses.  Persistent overall ill feeling (malaise), muscle aches, and tiredness (fatigue).  Persistent cough.  Yellow, green, or brown mucus production with coughing. HOME CARE INSTRUCTIONS   Only take over-the-counter or prescription medicines for pain, discomfort, diarrhea, or fever as directed by your caregiver.  Drink enough water and fluids to keep your urine clear or pale yellow. Sports drinks can provide valuable electrolytes, sugars, and hydration.  Get plenty of rest and maintain proper nutrition. Soups and broths with crackers or rice are fine. SEEK IMMEDIATE MEDICAL CARE IF:   You have severe headaches, shortness of breath, chest pain, neck pain, or an unusual rash.  You have uncontrolled vomiting, diarrhea, or you are unable to keep down fluids.  You or your child has an oral temperature above 102 F (38.9 C), not controlled by medicine.  Your baby is older than 3 months with a rectal temperature of 102 F (38.9 C) or higher.  Your baby is 3  months old or younger with a rectal temperature of 100.4 F (38 C) or higher. MAKE SURE YOU:   Understand these instructions.  Will watch your condition.  Will get help right away if you are not doing well or get worse. Document Released: 08/18/2005 Document Revised: 01/31/2012 Document Reviewed: 03/15/2011 ExitCare Patient Information 2015 [employer]. This information is not intended to replace advice given to you by your health care provider. Make sure you discuss any questions you have with your health care provider.  Upper Respiratory Infection, Adult An upper respiratory infection (URI) is also sometimes known as the common cold. The upper respiratory tract includes the nose, sinuses, throat, trachea, and bronchi. Bronchi are the airways leading to the lungs. Most people improve within 1 week, but symptoms can last up to 2 weeks. A residual cough may last even longer.  CAUSES Many different viruses can infect the tissues lining the upper respiratory tract. The tissues become irritated and inflamed and often become very moist. Mucus production is also common. A cold is contagious. You can easily spread the virus to others by oral contact. This includes kissing, sharing a glass, coughing, or sneezing. Touching your mouth or nose and then touching a surface, which is then touched by another person, can also spread the virus. SYMPTOMS  Symptoms typically develop 1 to 3 days after you come in contact with a cold virus. Symptoms vary from person to person. They may include:  Runny nose.  Sneezing.  Nasal congestion.  Sinus irritation.  Sore throat.  Loss of voice (laryngitis).  Cough.  Fatigue.  Muscle aches.  Loss of appetite.  Headache.  Low-grade fever. DIAGNOSIS  You   might diagnose your own cold based on familiar symptoms, since most people get a cold 2 to 3 times a year. Your caregiver can confirm this based on your exam. Most importantly, your caregiver can check  that your symptoms are not due to another disease such as strep throat, sinusitis, pneumonia, asthma, or epiglottitis. Blood tests, throat tests, and X-rays are not necessary to diagnose a common cold, but they may sometimes be helpful in excluding other more serious diseases. Your caregiver will decide if any further tests are required. RISKS AND COMPLICATIONS  You may be at risk for a more severe case of the common cold if you smoke cigarettes, have chronic heart disease (such as heart failure) or lung disease (such as asthma), or if you have a weakened immune system. The very young and very old are also at risk for more serious infections. Bacterial sinusitis, middle ear infections, and bacterial pneumonia can complicate the common cold. The common cold can worsen asthma and chronic obstructive pulmonary disease (COPD). Sometimes, these complications can require emergency medical care and may be life-threatening. PREVENTION  The best way to protect against getting a cold is to practice good hygiene. Avoid oral or hand contact with people with cold symptoms. Wash your hands often if contact occurs. There is no clear evidence that vitamin C, vitamin E, echinacea, or exercise reduces the chance of developing a cold. However, it is always recommended to get plenty of rest and practice good nutrition. TREATMENT  Treatment is directed at relieving symptoms. There is no cure. Antibiotics are not effective, because the infection is caused by a virus, not by bacteria. Treatment may include:  Increased fluid intake. Sports drinks offer valuable electrolytes, sugars, and fluids.  Breathing heated mist or steam (vaporizer or shower).  Eating chicken soup or other clear broths, and maintaining good nutrition.  Getting plenty of rest.  Using gargles or lozenges for comfort.  Controlling fevers with ibuprofen or acetaminophen as directed by your caregiver.  Increasing usage of your inhaler if you have  asthma. Zinc gel and zinc lozenges, taken in the first 24 hours of the common cold, can shorten the duration and lessen the severity of symptoms. Pain medicines may help with fever, muscle aches, and throat pain. A variety of non-prescription medicines are available to treat congestion and runny nose. Your caregiver can make recommendations and may suggest nasal or lung inhalers for other symptoms.  HOME CARE INSTRUCTIONS   Only take over-the-counter or prescription medicines for pain, discomfort, or fever as directed by your caregiver.  Use a warm mist humidifier or inhale steam from a shower to increase air moisture. This may keep secretions moist and make it easier to breathe.  Drink enough water and fluids to keep your urine clear or pale yellow.  Rest as needed.  Return to work when your temperature has returned to normal or as your caregiver advises. You may need to stay home longer to avoid infecting others. You can also use a face mask and careful hand washing to prevent spread of the virus. SEEK MEDICAL CARE IF:   After the first few days, you feel you are getting worse rather than better.  You need your caregiver's advice about medicines to control symptoms.  You develop chills, worsening shortness of breath, or brown or red sputum. These may be signs of pneumonia.  You develop yellow or brown nasal discharge or pain in the face, especially when you bend forward. These may be signs of sinusitis.    You develop a fever, swollen neck glands, pain with swallowing, or white areas in the back of your throat. These may be signs of strep throat. SEEK IMMEDIATE MEDICAL CARE IF:   You have a fever.  You develop severe or persistent headache, ear pain, sinus pain, or chest pain.  You develop wheezing, a prolonged cough, cough up blood, or have a change in your usual mucus (if you have chronic lung disease).  You develop sore muscles or a stiff neck. Document Released: 05/04/2001  Document Revised: 01/31/2012 Document Reviewed: 02/13/2014 ExitCare Patient Information 2015 [employer]. This information is not intended to replace advice given to you by your health care provider. Make sure you discuss any questions you have with your health care provider.   

## 2014-11-30 ENCOUNTER — Encounter (HOSPITAL_COMMUNITY): Payer: Self-pay

## 2014-11-30 ENCOUNTER — Emergency Department (HOSPITAL_COMMUNITY)
Admission: EM | Admit: 2014-11-30 | Discharge: 2014-11-30 | Disposition: A | Discharge status: Home / Self Care | Payer: Self-pay | Attending: Emergency Medicine | Admitting: Emergency Medicine

## 2014-11-30 DIAGNOSIS — Z8659 Personal history of other mental and behavioral disorders: Secondary | ICD-10-CM | POA: Insufficient documentation

## 2014-11-30 DIAGNOSIS — Z720 Tobacco use: Secondary | ICD-10-CM | POA: Insufficient documentation

## 2014-11-30 DIAGNOSIS — Z792 Long term (current) use of antibiotics: Secondary | ICD-10-CM | POA: Insufficient documentation

## 2014-11-30 DIAGNOSIS — J0100 Acute maxillary sinusitis, unspecified: Secondary | ICD-10-CM | POA: Insufficient documentation

## 2014-11-30 DIAGNOSIS — Z8719 Personal history of other diseases of the digestive system: Secondary | ICD-10-CM | POA: Insufficient documentation

## 2014-11-30 DIAGNOSIS — R05 Cough: Secondary | ICD-10-CM | POA: Insufficient documentation

## 2014-11-30 LAB — COMPREHENSIVE METABOLIC PANEL
ALT: 16 U/L (ref 0–35)
ANION GAP: 10 (ref 5–15)
AST: 21 U/L (ref 0–37)
Albumin: 3.5 g/dL (ref 3.5–5.2)
Alkaline Phosphatase: 80 U/L (ref 39–117)
CO2: 21 mmol/L (ref 19–32)
CREATININE: 0.67 mg/dL (ref 0.50–1.10)
Calcium: 8.3 mg/dL — ABNORMAL LOW (ref 8.4–10.5)
Chloride: 105 mEq/L (ref 96–112)
GFR calc Af Amer: >90 mL/min (ref >90)
GFR calc non Af Amer: >90 mL/min (ref >90)
Glucose, Bld: 112 mg/dL — ABNORMAL HIGH (ref 70–99)
Potassium: 3 mmol/L — ABNORMAL LOW (ref 3.5–5.1)
SODIUM: 136 mmol/L (ref 135–145)
Total Bilirubin: 0.5 mg/dL (ref 0.3–1.2)
Total Protein: 6.3 g/dL (ref 6.0–8.3)

## 2014-11-30 LAB — CBC WITH DIFFERENTIAL/PLATELET
Basophils Absolute: 0 10*3/uL (ref 0.0–0.1)
Basophils Relative: 0 % (ref 0–1)
Eosinophils Absolute: 0.1 10*3/uL (ref 0.0–0.7)
Eosinophils Relative: 0 % (ref 0–5)
HCT: 36.5 % (ref 36.0–46.0)
HEMOGLOBIN: 12.2 g/dL (ref 12.0–15.0)
Lymphocytes Relative: 14 % (ref 12–46)
Lymphs Abs: 2.4 10*3/uL (ref 0.7–4.0)
MCH: 26.2 pg (ref 26.0–34.0)
MCHC: 33.4 g/dL (ref 30.0–36.0)
MCV: 78.3 fL (ref 78.0–100.0)
Monocytes Absolute: 1.9 10*3/uL — ABNORMAL HIGH (ref 0.1–1.0)
Monocytes Relative: 11 % (ref 3–12)
Neutro Abs: 13 10*3/uL — ABNORMAL HIGH (ref 1.7–7.7)
Neutrophils Relative %: 75 % (ref 43–77)
Platelets: 214 10*3/uL (ref 150–400)
RBC: 4.66 MIL/uL (ref 3.87–5.11)
RDW: 13.2 % (ref 11.5–15.5)
WBC: 17.4 10*3/uL — ABNORMAL HIGH (ref 4.0–10.5)

## 2014-11-30 LAB — CULTURE, GROUP A STREP

## 2014-11-30 MED ORDER — AMOXICILLIN 500 MG PO CAPS: 500.0000 mg | ORAL_CAPSULE | Freq: Three times a day (TID) | ORAL | Status: DC

## 2014-11-30 MED ORDER — HYDROCODONE-HOMATROPINE 5-1.5 MG/5ML PO SYRP: 5.0000 mL | ORAL_SOLUTION | Freq: Four times a day (QID) | ORAL | Status: DC | PRN

## 2014-11-30 NOTE — Discharge Instructions (Signed)
Take amoxicillin as directed until gone. Refer to attached documents for more information. Return to the ED with worsening or concerning symptoms.  °

## 2014-11-30 NOTE — ED Notes (Signed)
Pt states started feeling bad wed night, c/o roof of mouth and sinuses hurting down to throat and neck, c/o fevers, went to urgent care yesterday and tested for strep which was negative, no c/o of generalized body aches

## 2014-11-30 NOTE — ED Provider Notes (Signed)
CSN: [id number]     Arrival date & time 11/30/14  0350 History   First MD Initiated Contact with Patient 11/30/14 0403     Chief Complaint  Patient presents with  . Fever     (Consider location/radiation/quality/duration/timing/severity/associated sxs/prior Treatment) HPI Comments: Patient is a 29 year old female who presents with a 4 day history of sore throat. Patient reports gradual onset and progressively worsening sharp, severe throat pain. The pain is constant and made worse with swallowing. The pain is localized to the patient's throat and equal on both sides. Nothing alleviates the pain. The patient has tried ibuprofen and OTC cold medicine without relief. Patient reports associated subjective fever, cervical adenopathy, sinus congestion and non productive cough. Patient denies headache, visual changes, difficulty breathing, chest pain, SOB, abdominal pain, NVD. Patient was seen at Urgent Care 2 days ago and told she had a URI.     Patient is a 29 y.o. female presenting with fever.  Fever Associated symptoms: congestion, cough and sore throat   Associated symptoms: no chest pain, no chills, no diarrhea, no dysuria, no nausea and no vomiting     Past Medical History  Diagnosis Date  . No pertinent past medical history   . Appendicitis     appendectomy 03/20/12  . IBS (irritable bowel syndrome)   . Anxiety    Past Surgical History  Procedure Laterality Date  . Appendectomy  03/20/2012  . Laparoscopic appendectomy  03/20/2012    Procedure: APPENDECTOMY LAPAROSCOPIC;  Surgeon: [name], DO;  Location: [facility] OR;  Service: General;  Laterality: N/A;  Lap. App.  . Cesarean section  2004, 2007    x 2   . Tympanoplasty Left 1992 and 1995   No family history on file. History  Substance Use Topics  . Smoking status: Current Every Day Smoker -- 1.00 packs/day    Types: Cigarettes  . Smokeless tobacco: Never Used  . Alcohol Use: Yes     Comment: occ   OB History    Gravida  Para Term Preterm AB TAB SAB Ectopic Multiple Living   Review of Systems  Constitutional: Positive for fever. Negative for chills and fatigue.  HENT: Positive for congestion and sore throat. Negative for trouble swallowing.   Eyes: Negative for visual disturbance.  Respiratory: Positive for cough. Negative for shortness of breath.   Cardiovascular: Negative for chest pain and palpitations.  Gastrointestinal: Negative for nausea, vomiting, abdominal pain and diarrhea.  Genitourinary: Negative for dysuria and difficulty urinating.  Musculoskeletal: Negative for arthralgias and neck pain.  Skin: Negative for color change.  Neurological: Negative for dizziness and weakness.  Psychiatric/Behavioral: Negative for dysphoric mood.      Allergies  Codeine  Home Medications   Prior to Admission medications   Medication Sig Start Date End Date Taking? Authorizing Provider  ciprofloxacin (CIPRO) 500 MG tablet Take 1 tablet (500 mg total) by mouth 2 (two) times daily. 08/11/14   Aviva Signs, CNM  metroNIDAZOLE (FLAGYL) 500 MG tablet Take 1 tablet (500 mg total) by mouth 2 (two) times daily. 08/04/14   Louann Sjogren, PA-C  phenazopyridine (PYRIDIUM) 100 MG tablet Take 1 tablet (100 mg total) by mouth 3 (three) times daily as needed for pain. 08/11/14   Aviva Signs, CNM   BP 113/65 mmHg  Pulse 93  Temp(Src) 98.9 F (37.2 C) (Oral)  Resp 14  Ht  (1.753  m)  Wt 162 lb (73.483 kg)  BMI 23.91 kg/m2  SpO2 99%  LMP 11/21/2014 Physical Exam  Constitutional: She is oriented to person, place, and time. She appears well-developed and well-nourished. No distress.  HENT:  Head: Normocephalic and atraumatic.  Mouth/Throat: Oropharyngeal exudate present.  Bilateral tonsillar edema, erythema, and exudate.   Eyes: Conjunctivae and EOM are normal.  Neck: Normal range of motion.  Cardiovascular: Normal rate and regular rhythm.  Exam reveals no gallop and no friction  rub.   No murmur heard. Pulmonary/Chest: Effort normal and breath sounds normal. She has no wheezes. She has no rales. She exhibits no tenderness.  Abdominal: Soft. There is no tenderness.  Musculoskeletal: Normal range of motion.  Lymphadenopathy:    She has cervical adenopathy.  Neurological: She is alert and oriented to person, place, and time. Coordination normal.  Speech is goal-oriented. Moves limbs without ataxia.   Skin: Skin is warm and dry.  Psychiatric: She has a normal mood and affect. Her behavior is normal.  Nursing note and vitals reviewed.   ED Course  Procedures (including critical care time) Labs Review Labs Reviewed  CBC WITH DIFFERENTIAL - Abnormal; Notable for the following:    WBC 17.4 (*)    Neutro Abs 13.0 (*)    Monocytes Absolute 1.9 (*)    All other components within normal limits  COMPREHENSIVE METABOLIC PANEL    Imaging Review No results found.   EKG Interpretation None      MDM   Final diagnoses:  Acute maxillary sinusitis, recurrence not specified    4:54 AM Patient will have amoxicillin for sinusitis. Patient will have hycodan for cough. Patient mildly tachycardic likely due to congestion. Remaining vitals stable. Patient instructed to return with worsening or concerning symptoms.    Emilia BeckKaitlyn Zyshawn Bohnenkamp, PA-C 11/30/14 0459  Enid SkeensJoshua M Zavitz, MD 11/30/14 [phone]

## 2015-03-03 ENCOUNTER — Emergency Department (HOSPITAL_COMMUNITY): Admission: EM | Admit: 2015-03-03 | Discharge: 2015-03-03 | Discharge status: Absent without leave | Payer: Self-pay

## 2015-10-19 ENCOUNTER — Encounter (HOSPITAL_COMMUNITY): Payer: Self-pay | Admitting: *Deleted

## 2015-10-19 ENCOUNTER — Emergency Department (HOSPITAL_COMMUNITY)
Admission: EM | Admit: 2015-10-19 | Discharge: 2015-10-19 | Disposition: A | Discharge status: Home / Self Care | Payer: Self-pay | Attending: Emergency Medicine | Admitting: Emergency Medicine

## 2015-10-19 DIAGNOSIS — Z3202 Encounter for pregnancy test, result negative: Secondary | ICD-10-CM | POA: Insufficient documentation

## 2015-10-19 DIAGNOSIS — Z8719 Personal history of other diseases of the digestive system: Secondary | ICD-10-CM | POA: Insufficient documentation

## 2015-10-19 DIAGNOSIS — S80862A Insect bite (nonvenomous), left lower leg, initial encounter: Secondary | ICD-10-CM | POA: Insufficient documentation

## 2015-10-19 DIAGNOSIS — Y998 Other external cause status: Secondary | ICD-10-CM | POA: Insufficient documentation

## 2015-10-19 DIAGNOSIS — W57XXXA Bitten or stung by nonvenomous insect and other nonvenomous arthropods, initial encounter: Secondary | ICD-10-CM | POA: Insufficient documentation

## 2015-10-19 DIAGNOSIS — R51 Headache: Secondary | ICD-10-CM | POA: Insufficient documentation

## 2015-10-19 DIAGNOSIS — Y9389 Activity, other specified: Secondary | ICD-10-CM | POA: Insufficient documentation

## 2015-10-19 DIAGNOSIS — Y9289 Other specified places as the place of occurrence of the external cause: Secondary | ICD-10-CM | POA: Insufficient documentation

## 2015-10-19 DIAGNOSIS — F17210 Nicotine dependence, cigarettes, uncomplicated: Secondary | ICD-10-CM | POA: Insufficient documentation

## 2015-10-19 DIAGNOSIS — Z8659 Personal history of other mental and behavioral disorders: Secondary | ICD-10-CM | POA: Insufficient documentation

## 2015-10-19 LAB — CBC WITH DIFFERENTIAL/PLATELET
BASOS PCT: 1 %
Basophils Absolute: 0.1 10*3/uL (ref 0.0–0.1)
EOS ABS: 0.1 10*3/uL (ref 0.0–0.7)
EOS PCT: 1 %
HCT: 41.4 % (ref 36.0–46.0)
Hemoglobin: 13.6 g/dL (ref 12.0–15.0)
LYMPHS ABS: 3.5 10*3/uL (ref 0.7–4.0)
Lymphocytes Relative: 34 %
MCH: 26.6 pg (ref 26.0–34.0)
MCHC: 32.9 g/dL (ref 30.0–36.0)
MCV: 80.9 fL (ref 78.0–100.0)
MONO ABS: 0.9 10*3/uL (ref 0.1–1.0)
MONOS PCT: 8 %
Neutro Abs: 5.9 10*3/uL (ref 1.7–7.7)
Neutrophils Relative %: 56 %
PLATELETS: 266 10*3/uL (ref 150–400)
RBC: 5.12 MIL/uL — ABNORMAL HIGH (ref 3.87–5.11)
RDW: 13.8 % (ref 11.5–15.5)
WBC: 10.4 10*3/uL (ref 4.0–10.5)

## 2015-10-19 LAB — BASIC METABOLIC PANEL
Anion gap: 7 (ref 5–15)
BUN: 8 mg/dL (ref 6–20)
CALCIUM: 9.1 mg/dL (ref 8.9–10.3)
CHLORIDE: 105 mmol/L (ref 101–111)
CO2: 25 mmol/L (ref 22–32)
CREATININE: 0.73 mg/dL (ref 0.44–1.00)
GFR calc Af Amer: >60 mL/min (ref >60)
Glucose, Bld: 95 mg/dL (ref 65–99)
Potassium: 3.9 mmol/L (ref 3.5–5.1)
SODIUM: 137 mmol/L (ref 135–145)

## 2015-10-19 LAB — I-STAT BETA HCG BLOOD, ED (MC, WL, AP ONLY)

## 2015-10-19 MED ORDER — DOXYCYCLINE HYCLATE 100 MG PO TABS
100.0000 mg | ORAL_TABLET | Freq: Once | ORAL | Status: AC
  Administered 2015-10-19: 100 mg via ORAL
  Filled 2015-10-19: qty 1

## 2015-10-19 MED ORDER — DOXYCYCLINE HYCLATE 100 MG PO CAPS: 100.0000 mg | ORAL_CAPSULE | Freq: Two times a day (BID) | ORAL | Status: DC

## 2015-10-19 NOTE — ED Notes (Signed)
Patient presents with red area and bite to left lower leg  Looks like a bull eyes

## 2015-10-19 NOTE — Discharge Instructions (Signed)
Lyme Disease  Lyme disease is an infection that affects many parts of the body, including the skin, joints, and nervous system.  CAUSES  Lyme disease is caused by bacteria called Borrelia burgdorferi.  You can get Lyme disease by being bitten by an infected tick. The tick must be attached to your skin for at least 36 hours to transmit the infection. Deer often carry infected ticks.  RISK FACTORS  · Living in or visiting [place], the [place] states, or the [place].  · Spending time in wooded or grassy areas.  · Being outdoors with exposed skin.  · Failing to remove a tick from your skin within 3-4 days.  SIGNS AND SYMPTOMS  · A round, red rash that comes out from the center of the tick bite. This is the first sign of infection. The center of the rash may be blood colored or have tiny blisters.  · Fatigue.  · Headache.  · Chills and fever.  · General achiness.  · Joint pain, often in the knee.  · Swollen lymph glands.  DIAGNOSIS  Lyme disease is diagnosed with a medical history, physical exam, and blood test.  TREATMENT  The main treatment is antibiotic medicine, usually taken by mouth. The length of treatment depends on how soon after a tick bite you begin taking the medicine. In some cases, treatment is necessary for several weeks. If the infection is severe, IV antibiotics may be necessary.  HOME CARE INSTRUCTIONS  · Take your antibiotic medicine as directed by your health care provider. Finish the antibiotic even if you start to feel better.  · You may take a probiotic in between doses of your antibiotic to help avoid stomach upset or diarrhea.  · Check with your health care provider before supplementing your treatment. Many alternative therapies have not been proven and may be harmful to you.  · Keep all follow-up visits as directed by your health care provider. This is important.  PREVENTION  Reinfection is possible with another tick bite by an infected tick. Take these precautions to prevent an  infection:  · Cover your skin with light-colored clothing when outdoors in the spring and summer months.  · Spray clothing and skin with bug spray. The spray should be 20-30% DEET.  · Avoided wooded, grassy, and shaded areas.  · Remove yard litter, brush, trash, and plants that attract deer and rodents.  · Check yourself for ticks when you come indoors.  · Wash clothing worn each day.  · Check your pets for ticks before they come inside.  · If you find a tick:    Remove it with tweezers.    Clean your hands and the bite area with rubbing alcohol or soap and water.  Pregnant women should take special care to avoid tick bites because the infection can be passed along to the fetus.  SEEK MEDICAL CARE IF:  · You have symptoms after treatment.  · You have removed a tick and want to bring it to your health care provider for testing.  SEEK IMMEDIATE MEDICAL CARE IF:  · You have an irregular heartbeat.  · You have nerve pain.  · Your face feels numb.  MAKE SURE YOU:  · Understand these instructions.  · Will watch your condition.  · Will get help right away if you are not doing well or get worse.     This information is not intended to replace advice given to you by your health care provider. Make   sure you discuss any questions you have with your health care provider.     Document Released: 02/14/2001 Document Revised: 11/29/2014 Document Reviewed: 03/26/2014  Elsevier Interactive Patient Education ©2016 [employer]

## 2015-10-19 NOTE — ED Provider Notes (Signed)
CSN: [id number]     Arrival date & time 10/19/15  0108 History  By signing my name below, I, Evon Slackerrance Branch, attest that this documentation has been prepared under the direction and in the presence of No att. providers found. Electronically Signed: Evon Slackerrance Branch, ED Scribe. 10/19/2015. 6:54 AM.    Chief Complaint  Patient presents with  . Insect Bite   The history is provided by the patient. No language interpreter was used.   HPI Comments: Cindy Livingston is a 29 y.o. female who presents to the Emergency Department complaining of tick bite to her left lower leg onset 28 hours night prior. Pt states that she removed a tick from the area. Pt presents with rash at the site of the tick bite. She states that the area is tender and is more painful when she ambulates or moves her legs. Denies fever, nausea or vomiting. No myalgias or arthralgias. Chest pain or shortness of breath. Patient complains of slight generalized headache today. No neck pain or stiffness. No focal weakness or numbness. No visual changes.    Past Medical History  Diagnosis Date  . No pertinent past medical history   . Appendicitis     appendectomy 03/20/12  . IBS (irritable bowel syndrome)   . Anxiety    Past Surgical History  Procedure Laterality Date  . Appendectomy  03/20/2012  . Laparoscopic appendectomy  03/20/2012    Procedure: APPENDECTOMY LAPAROSCOPIC;  Surgeon: Lodema PilotBrian Layton, DO;  Location: [facility] OR;  Service: General;  Laterality: N/A;  Lap. App.  . Cesarean section  2004, 2007    x 2   . Tympanoplasty Left 1992 and 1995   No family history on file. Social History  Substance Use Topics  . Smoking status: Current Every Day Smoker -- 1.00 packs/day    Types: Cigarettes  . Smokeless tobacco: Never Used  . Alcohol Use: Yes     Comment: occ   OB History    Gravida Para Term Preterm AB TAB SAB Ectopic Multiple Living   2 2 1 1     1 3      Review of Systems  Constitutional: Negative for fever and chills.   Eyes: Negative for visual disturbance.  Respiratory: Negative for shortness of breath.   Cardiovascular: Negative for chest pain and leg swelling.  Gastrointestinal: Negative for nausea, vomiting, abdominal pain and diarrhea.  Musculoskeletal: Negative for myalgias and arthralgias.  Skin: Positive for rash.  Neurological: Positive for headaches. Negative for dizziness, weakness, light-headedness and numbness.  All other systems reviewed and are negative.     Allergies  Codeine  Home Medications   Prior to Admission medications   Medication Sig Start Date End Date Taking? Authorizing Provider  ibuprofen (ADVIL,MOTRIN) 200 MG tablet Take 800 mg by mouth every 6 (six) hours as needed for headache or moderate pain.   Yes Historical Provider, MD  doxycycline (VIBRAMYCIN) 100 MG capsule Take 1 capsule (100 mg total) by mouth 2 (two) times daily. One po bid x 7 days 10/19/15   Loren Raceravid Yasin Ducat, MD   BP 88/54 mmHg  Pulse 81  Temp(Src) 98.5 F (36.9 C) (Oral)  Resp 18  Ht 5\' 9"  (1.753 m)  Wt 165 lb (74.844 kg)  BMI 24.36 kg/m2  SpO2 98%  LMP 10/12/2015   Physical Exam  Constitutional: She is oriented to person, place, and time. She appears well-developed and well-nourished. No distress.  HENT:  Head: Normocephalic and atraumatic.  Mouth/Throat: Oropharynx is clear and moist.  No oropharyngeal exudate.  Eyes: EOM are normal. Pupils are equal, round, and reactive to light.  Neck: Normal range of motion. Neck supple.  Cardiovascular: Normal rate and regular rhythm.  Exam reveals no gallop and no friction rub.   No murmur heard. Pulmonary/Chest: Effort normal and breath sounds normal. No respiratory distress. She has no wheezes. She has no rales.  Abdominal: Soft. Bowel sounds are normal. She exhibits no distension and no mass. There is no tenderness. There is no rebound and no guarding.  Musculoskeletal: Normal range of motion. She exhibits no edema or tenderness.  Distal pulses  intact. No joint swelling, warmth or erythema.  Neurological: She is alert and oriented to person, place, and time.  Moves all extremities without deficit. Sensation is fully intact.  Skin: Skin is warm and dry. Rash noted. No erythema.  Patient has a erythematous circular rash chest distal to the left knee located posteriorly and medially. The rash is erythematous with "target-like" areas of clearing. Roughly 2 cm in diameter. Mild surrounding erythema with warmth and tenderness to touch.  Psychiatric: She has a normal mood and affect. Her behavior is normal.  Nursing note and vitals reviewed.   ED Course  Procedures (including critical care time) DIAGNOSTIC STUDIES: Oxygen Saturation is 99% on RA, normal by my interpretation.    COORDINATION OF CARE: 6:54 AM-Discussed treatment plan with pt at bedside and pt agreed to plan.     Labs Review Labs Reviewed  CBC WITH DIFFERENTIAL/PLATELET - Abnormal; Notable for the following:    RBC 5.12 (*)    All other components within normal limits  BASIC METABOLIC PANEL  LYME DISEASE DNA BY PCR(BORRELIA BURG)  ROCKY MTN SPOTTED FVR ABS PNL(IGG+IGM)  I-STAT BETA HCG BLOOD, ED ([facility], [facility], [facility] ONLY)    Imaging Review No results found.    EKG Interpretation None      MDM   Final diagnoses:  Tick bite of calf, left, initial encounter      I personally performed the services described in this documentation, which was scribed in my presence. The recorded information has been reviewed and is accurate.   Patient brought the tick to the emergency department in a container. Appears to be an adult deer tick.  Blood sent for Lyme disease and recommends spotted fever. We'll treat empirically with doxycycline. First dose given in the emergency department. We'll give follow-up with infectious disease. Patient's been given return precautions and is voiced understanding.       Loren Racer, MD 10/19/15 [phone]

## 2015-10-20 LAB — ROCKY MTN SPOTTED FVR ABS PNL(IGG+IGM)
RMSF IGG: NEGATIVE
RMSF IGM: 0.36 {index} (ref 0.00–0.89)

## 2016-05-19 ENCOUNTER — Ambulatory Visit (HOSPITAL_COMMUNITY)
Admission: EM | Admit: 2016-05-19 | Discharge: 2016-05-19 | Disposition: A | Discharge status: Home / Self Care | Payer: Self-pay | Attending: Family Medicine | Admitting: Family Medicine

## 2016-05-19 ENCOUNTER — Encounter (HOSPITAL_COMMUNITY): Payer: Self-pay | Admitting: Emergency Medicine

## 2016-05-19 DIAGNOSIS — H66015 Acute suppurative otitis media with spontaneous rupture of ear drum, recurrent, left ear: Secondary | ICD-10-CM

## 2016-05-19 MED ORDER — AMOXICILLIN 500 MG PO CAPS: 1000.0000 mg | ORAL_CAPSULE | Freq: Two times a day (BID) | ORAL | Status: DC

## 2016-05-19 MED ORDER — TRAMADOL HCL 50 MG PO TABS: 50.0000 mg | ORAL_TABLET | Freq: Four times a day (QID) | ORAL | Status: DC | PRN

## 2016-05-19 NOTE — ED Provider Notes (Signed)
CSN: [id number]     Arrival date & time 05/19/16  1504 History   First MD Initiated Contact with Patient 05/19/16 1536     Chief Complaint  Patient presents with  . Ear Drainage   (Consider location/radiation/quality/duration/timing/severity/associated sxs/prior Treatment) HPI Comments: 30 year old female complaining of discomfort to the left ear associated with drainage for one month. Last couple days she has also been having some dizziness. She has a history of tympanoplasty at the age of 89. She states in the past years that proximal every 6 months or so she has an infection or drainage from her ear as well as a history of ruptured TM.  Patient is a 30 y.o. female presenting with ear drainage.  Ear Drainage    Past Medical History  Diagnosis Date  . No pertinent past medical history   . Appendicitis     appendectomy 03/20/12  . IBS (irritable bowel syndrome)   . Anxiety    Past Surgical History  Procedure Laterality Date  . Appendectomy  03/20/2012  . Laparoscopic appendectomy  03/20/2012    Procedure: APPENDECTOMY LAPAROSCOPIC;  Surgeon: Lodema PilotBrian Layton, DO;  Location: [facility] OR;  Service: General;  Laterality: N/A;  Lap. App.  . Cesarean section  2004, 2007    x 2   . Tympanoplasty Left 1992 and 1995   History reviewed. No pertinent family history. Social History  Substance Use Topics  . Smoking status: Current Every Day Smoker -- 1.00 packs/day    Types: Cigarettes  . Smokeless tobacco: Never Used  . Alcohol Use: Yes     Comment: occ   OB History    Gravida Para Term Preterm AB TAB SAB Ectopic Multiple Living   2 2 1 1     1 3      Review of Systems  Constitutional: Negative.   HENT: Positive for ear pain. Negative for congestion, postnasal drip and rhinorrhea.   Cardiovascular: Negative.   Gastrointestinal: Negative.   Skin: Negative.   Neurological: Negative.   All other systems reviewed and are negative.   Allergies  Codeine  Home Medications   Prior to  Admission medications   Medication Sig Start Date End Date Taking? Authorizing Provider  amoxicillin (AMOXIL) 500 MG capsule Take 2 capsules (1,000 mg total) by mouth 2 (two) times daily. 05/19/16   Hayden Rasmussenavid Salwa Bai, NP  ibuprofen (ADVIL,MOTRIN) 200 MG tablet Take 800 mg by mouth every 6 (six) hours as needed for headache or moderate pain.    Historical Provider, MD  traMADol (ULTRAM) 50 MG tablet Take 1 tablet (50 mg total) by mouth every 6 (six) hours as needed. 05/19/16   Hayden Rasmussenavid Salvador Bigbee, NP   Meds Ordered and Administered this Visit  Medications - No data to display  BP 108/78 mmHg  Pulse 84  Temp(Src) 98.2 F (36.8 C) (Oral)  Resp 16  SpO2 98%  LMP 05/05/2016 No data found.   Physical Exam  Constitutional: She is oriented to person, place, and time. She appears well-developed and well-nourished. No distress.  HENT:  Head: Normocephalic and atraumatic.  Right TM with minor retraction otherwise normal appearing Left TM with abnormal architecture. There is dullness, absence of light reflex, appearance of rupture of the TM at the inferior portion with small amount of blood. There is a honey-colored fluid draining down the dependent portion of the ear canal.  Eyes: EOM are normal.  Neck: Normal range of motion. Neck supple.  Cardiovascular: Normal rate.   Pulmonary/Chest: Effort normal. No respiratory distress.  Musculoskeletal: She exhibits no edema.  Neurological: She is alert and oriented to person, place, and time. She exhibits normal muscle tone.  Skin: Skin is warm and dry.  Psychiatric: She has a normal mood and affect.  Nursing note and vitals reviewed.   ED Course  Procedures (including critical care time)  Labs Review Labs Reviewed - No data to display  Imaging Review No results found.   Visual Acuity Review  Right Eye Distance:   Left Eye Distance:   Bilateral Distance:    Right Eye Near:   Left Eye Near:    Bilateral Near:         MDM   1. 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 amoxicillin (AMOXIL) 500 MG capsule    Sig: Take 2 capsules (1,000 mg total) by mouth 2 (two) times daily.    Dispense:  40 capsule    Refill:  0    Order Specific Question:  Supervising Provider    Answer:  Linna HoffKINDL, JAMES D [phone][[id number]]  . traMADol (ULTRAM) 50 MG tablet    Sig: Take 1 tablet (50 mg total) by mouth every 6 (six) hours as needed.    Dispense:  15 tablet    Refill:  0    Order Specific Question:  Supervising Provider    Answer:  Linna HoffKINDL, JAMES D [phone][[id number]]  Follow-up with ENT as soon as you are able. For worsening or new symptoms or problems may return.   Hayden Rasmussenavid Danessa Mensch, NP 05/19/16 1616

## 2016-05-19 NOTE — ED Notes (Signed)
Patient reports an ear infection one month ago.  Patient did not see a doctor, but says she knows what an infection feels like.  Reports left ear draining for one month.  As of the last 2 days started feeling very bad: dizziness, pressure at base of neck, legs feel weak.

## 2016-05-19 NOTE — Discharge Instructions (Signed)

## 2016-08-27 ENCOUNTER — Encounter (HOSPITAL_COMMUNITY): Payer: Self-pay | Admitting: Emergency Medicine

## 2016-08-27 ENCOUNTER — Ambulatory Visit (HOSPITAL_COMMUNITY)
Admission: EM | Admit: 2016-08-27 | Discharge: 2016-08-27 | Disposition: A | Discharge status: Home / Self Care | Payer: Self-pay | Attending: Family Medicine | Admitting: Family Medicine

## 2016-08-27 ENCOUNTER — Ambulatory Visit (INDEPENDENT_AMBULATORY_CARE_PROVIDER_SITE_OTHER): Payer: Self-pay

## 2016-08-27 DIAGNOSIS — R0789 Other chest pain: Secondary | ICD-10-CM

## 2016-08-27 MED ORDER — DICLOFENAC POTASSIUM 50 MG PO TABS: 50.0000 mg | ORAL_TABLET | Freq: Three times a day (TID) | ORAL | 0 refills | Status: DC

## 2016-08-27 NOTE — Discharge Instructions (Signed)
You need to stop smoking. 

## 2016-08-27 NOTE — ED Triage Notes (Signed)
Pt c/o central/left sided CP onset yest  Reports it was intermittent when it first began but has become more constant  Increases w/activity and when taking deep breaths  Denies SOB, dyspnea, weakness, diaphoresis, HA  A&O x4.... NAD

## 2016-08-27 NOTE — ED Provider Notes (Signed)
[facility]    CSN: [id number] Arrival date & time: 08/27/16  1645     History   Chief Complaint Chief Complaint  Patient presents with  . Chest Pain    HPI Cindy Livingston is a 30 y.o. female.   The history is provided by the patient.  Chest Pain  Pain location:  L chest and R chest Pain quality: sharp and shooting   Pain severity:  Moderate Onset quality:  Sudden Chronicity:  New Context: lifting, movement and raising an arm   Relieved by:  None tried Worsened by:  Nothing Ineffective treatments:  None tried Associated symptoms: no cough, no diaphoresis, no fever, no nausea, no palpitations and no shortness of breath     Past Medical History:  Diagnosis Date  . Anxiety   . Appendicitis    appendectomy 03/20/12  . IBS (irritable bowel syndrome)   . No pertinent past medical history     There are no active problems to display for this patient.   Past Surgical History:  Procedure Laterality Date  . APPENDECTOMY  03/20/2012  . CESAREAN SECTION  2004, 2007   x 2   . LAPAROSCOPIC APPENDECTOMY  03/20/2012   Procedure: APPENDECTOMY LAPAROSCOPIC;  Surgeon: Lodema PilotBrian Layton, DO;  Location: [facility] OR;  Service: General;  Laterality: N/A;  Lap. App.  . TYMPANOPLASTY Left 1992 and 1995    OB History    Gravida Para Term Preterm AB Living   2 2 1 1   3    SAB TAB Ectopic Multiple Live Births         1 3       Home Medications    Prior to Admission medications   Medication Sig Start Date End Date Taking? Authorizing Provider  amoxicillin (AMOXIL) 500 MG capsule Take 2 capsules (1,000 mg total) by mouth 2 (two) times daily. 05/19/16   Hayden Rasmussenavid Mabe, NP  ibuprofen (ADVIL,MOTRIN) 200 MG tablet Take 800 mg by mouth every 6 (six) hours as needed for headache or moderate pain.    Historical Provider, MD  traMADol (ULTRAM) 50 MG tablet Take 1 tablet (50 mg total) by mouth every 6 (six) hours as needed. 05/19/16   Hayden Rasmussenavid Mabe, NP    Family History History reviewed. No  pertinent family history.  Social History Social History  Substance Use Topics  . Smoking status: Current Every Day Smoker    Packs/day: 1.00    Types: Cigarettes  . Smokeless tobacco: Never Used  . Alcohol use Yes     Comment: occ     Allergies   Codeine   Review of Systems Review of Systems  Constitutional: Negative for diaphoresis and fever.  Respiratory: Negative for cough and shortness of breath.   Cardiovascular: Positive for chest pain. Negative for palpitations.  Gastrointestinal: Negative for nausea.     Physical Exam Triage Vital Signs ED Triage Vitals  Enc Vitals Group     BP 08/27/16 1705 124/82     Pulse Rate 08/27/16 1705 106     Resp 08/27/16 1705 12     Temp 08/27/16 1705 98.2 F (36.8 C)     Temp Source 08/27/16 1705 Oral     SpO2 08/27/16 1705 100 %     Weight --      Height --      Head Circumference --      Peak Flow --      Pain Score 08/27/16 1721 6     Pain Loc --  Pain Edu? --      Excl. in GC? --    No data found.   Updated Vital Signs BP 124/82 (BP Location: Left Arm)   Pulse 106   Temp 98.2 F (36.8 C) (Oral)   Resp 12   SpO2 100%   Visual Acuity Right Eye Distance:   Left Eye Distance:   Bilateral Distance:    Right Eye Near:   Left Eye Near:    Bilateral Near:     Physical Exam  Constitutional: She is oriented to person, place, and time. She appears well-developed and well-nourished.  Neck: Normal range of motion. Neck supple.  Cardiovascular: Normal rate, regular rhythm, normal heart sounds and intact distal pulses.   Pulmonary/Chest: Effort normal and breath sounds normal. She has no wheezes. She exhibits tenderness.  Lymphadenopathy:    She has no cervical adenopathy.  Neurological: She is alert and oriented to person, place, and time.  Skin: Skin is warm and dry.  Nursing note and vitals reviewed.    UC Treatments / Results  Labs (all labs ordered are listed, but only abnormal results are  displayed) Labs Reviewed - No data to display  EKG  EKG Interpretation None       Radiology No results found. X-rays reviewed and report per radiologist.  Procedures Procedures (including critical care time)  Medications Ordered in UC Medications - No data to display   Initial Impression / Assessment and Plan / UC Course  I have reviewed the triage vital signs and the nursing notes.  Pertinent labs & imaging results that were available during my care of the patient were reviewed by me and considered in my medical decision making (see chart for details).  Clinical Course      Final Clinical Impressions(s) / UC Diagnoses   Final diagnoses:  None    New Prescriptions New Prescriptions   No medications on file     Linna Hoff, MD 08/27/16 1821

## 2016-12-02 ENCOUNTER — Encounter (HOSPITAL_COMMUNITY): Payer: Self-pay | Admitting: Emergency Medicine

## 2016-12-02 ENCOUNTER — Ambulatory Visit (HOSPITAL_COMMUNITY)
Admission: EM | Admit: 2016-12-02 | Discharge: 2016-12-02 | Disposition: A | Discharge status: Home / Self Care | Payer: Self-pay | Attending: Emergency Medicine | Admitting: Emergency Medicine

## 2016-12-02 DIAGNOSIS — K581 Irritable bowel syndrome with constipation: Secondary | ICD-10-CM

## 2016-12-02 DIAGNOSIS — K625 Hemorrhage of anus and rectum: Secondary | ICD-10-CM

## 2016-12-02 LAB — OCCULT BLOOD, POC DEVICE: Fecal Occult Bld: POSITIVE — AB

## 2016-12-02 MED ORDER — HYDROCORTISONE ACETATE 25 MG RE SUPP: 25.0000 mg | Freq: Two times a day (BID) | RECTAL | 0 refills | Status: DC

## 2016-12-02 NOTE — ED Provider Notes (Signed)
CSN: [id number]     Arrival date & time 12/02/16  1108 History   First MD Initiated Contact with Patient 12/02/16 1323     Chief Complaint  Patient presents with  . Rectal Bleeding   (Consider location/radiation/quality/duration/timing/severity/associated sxs/prior Treatment) 31 year old female with a history of IBS constipation predominant presents to the urgent care with bright red rectal bleeding since last night. She states that is continuous. She states that she has to strain a lot and is had hemorrhoids in the past. She states this is different in that when she has had bleeding in the past usually resolves within a few hours but this is persistent.      Past Medical History:  Diagnosis Date  . Anxiety   . Appendicitis    appendectomy 03/20/12  . IBS (irritable bowel syndrome)   . No pertinent past medical history    Past Surgical History:  Procedure Laterality Date  . APPENDECTOMY  03/20/2012  . CESAREAN SECTION  2004, 2007   x 2   . LAPAROSCOPIC APPENDECTOMY  03/20/2012   Procedure: APPENDECTOMY LAPAROSCOPIC;  Surgeon: Lodema PilotBrian Layton, DO;  Location: [facility] OR;  Service: General;  Laterality: N/A;  Lap. App.  . TYMPANOPLASTY Left 1992 and 1995   History reviewed. No pertinent family history. Social History  Substance Use Topics  . Smoking status: Current Every Day Smoker    Packs/day: 1.00    Types: Cigarettes  . Smokeless tobacco: Never Used  . Alcohol use Yes     Comment: occ   OB History    Gravida Para Term Preterm AB Living   2 2 1 1   3    SAB TAB Ectopic Multiple Live Births         1 3     Review of Systems  Constitutional: Positive for activity change. Negative for fatigue and fever.  HENT: Negative.   Respiratory: Negative.   Cardiovascular: Negative.   Gastrointestinal: Positive for anal bleeding and constipation.  Genitourinary: Negative.   Skin: Negative.   All other systems reviewed and are negative.   Allergies  Codeine  Home Medications    Prior to Admission medications   Medication Sig Start Date End Date Taking? Authorizing Provider  hydrocortisone (ANUSOL-HC) 25 MG suppository Place 1 suppository (25 mg total) rectally 2 (two) times daily. 12/02/16   Hayden Rasmussenavid Louanne Calvillo, NP   Meds Ordered and Administered this Visit  Medications - No data to display  BP 105/69 (BP Location: Left Arm)   Pulse 92   Temp 98.5 F (36.9 C) (Oral)   Resp 16   LMP 11/25/2016 (LMP Unknown)   SpO2 100%  No data found.   Physical Exam  Constitutional: She is oriented to person, place, and time. She appears well-developed and well-nourished. No distress.  Eyes: EOM are normal.  Neck: Normal range of motion. Neck supple.  Cardiovascular: Normal rate.   Pulmonary/Chest: Effort normal. No respiratory distress.  Genitourinary:  Genitourinary CommentsClyda Hurdle: Livia, EMT present. External anal view revealed small old hemorrhoidal tag. No other lesions. No active hemorrhoids and no evidence of bleeding or other lesions externally. Anal sphincter tone is normal. There is no sign of fissure. The inner rim was palpated 360 and no lesions, lumps, bumps or other abnormal structures were palpated. There was no tenderness. No evidence of bright red bleeding. Very small amount of brown stool in the rectal vault. This tested positive on Hemoccult.  Musculoskeletal: She exhibits no edema.  Neurological: She is alert and oriented to  person, place, and time. She exhibits normal muscle tone.  Skin: Skin is warm and dry.  Psychiatric: She has a normal mood and affect.  Nursing note and vitals reviewed.   Urgent Care Course   Clinical Course     Procedures (including critical care time)  Labs Review Labs Reviewed - No data to display  Imaging Review No results found.   Visual Acuity Review  Right Eye Distance:   Left Eye Distance:   Bilateral Distance:    Right Eye Near:   Left Eye Near:    Bilateral Near:         MDM   1. Rectal bleeding   2.  Irritable bowel syndrome with constipation    The source for rectal bleeding was not determined today. It is recommended to follow-up with the gastroenterologist listed on page one. Call today for an appointment. If your bleeding does not stop or if it increases and he develop other symptoms such as abdominal pain, weakness or feeling as though you are going to pass out he should go to the emergency department promptly. Use the Anusol suppositories for possible inflammation within the rectum. This may or may not help since we do not have a specific diagnosis. There does not appear to be a  fissure or obvious hemorrhoid at this time. Meds ordered this encounter  Medications  . hydrocortisone (ANUSOL-HC) 25 MG suppository    Sig: Place 1 suppository (25 mg total) rectally 2 (two) times daily.    Dispense:  12 suppository    Refill:  0    Order Specific Question:   Supervising Provider    Answer:   Charm Rings [[id number]]       Hayden Rasmussen, NP 12/02/16 1350

## 2016-12-02 NOTE — Discharge Instructions (Signed)
The source for rectal bleeding was not determined today. It is recommended to follow-up with the gastroenterologist listed on page one. Call today for an appointment. If your bleeding does not stop or if it increases and he develop other symptoms such as abdominal pain, weakness or feeling as though you are going to pass out he should go to the emergency department promptly. Use the Anusol suppositories for possible inflammation within the rectum. This may or may not help since we do not have a specific diagnosis. There does not appear to be a  fissure or obvious hemorrhoid at this time.

## 2016-12-02 NOTE — ED Triage Notes (Addendum)
Here for rectal bleeding onset last night associated w/constipation  Hx of IBS and external hemorrhoids  Denies feeling weak or dizziness  A&O x4... NAD

## 2016-12-04 ENCOUNTER — Emergency Department (HOSPITAL_COMMUNITY)
Admission: EM | Admit: 2016-12-04 | Discharge: 2016-12-04 | Disposition: A | Discharge status: Home / Self Care | Payer: Self-pay | Attending: Emergency Medicine | Admitting: Emergency Medicine

## 2016-12-04 ENCOUNTER — Encounter (HOSPITAL_COMMUNITY): Payer: Self-pay | Admitting: Emergency Medicine

## 2016-12-04 DIAGNOSIS — K625 Hemorrhage of anus and rectum: Secondary | ICD-10-CM

## 2016-12-04 DIAGNOSIS — K644 Residual hemorrhoidal skin tags: Secondary | ICD-10-CM | POA: Insufficient documentation

## 2016-12-04 DIAGNOSIS — K649 Unspecified hemorrhoids: Secondary | ICD-10-CM

## 2016-12-04 DIAGNOSIS — F17210 Nicotine dependence, cigarettes, uncomplicated: Secondary | ICD-10-CM | POA: Insufficient documentation

## 2016-12-04 LAB — CBC
HEMATOCRIT: 41.7 % (ref 36.0–46.0)
Hemoglobin: 13.9 g/dL (ref 12.0–15.0)
MCH: 26.4 pg (ref 26.0–34.0)
MCHC: 33.3 g/dL (ref 30.0–36.0)
MCV: 79.3 fL (ref 78.0–100.0)
Platelets: 322 10*3/uL (ref 150–400)
RBC: 5.26 MIL/uL — ABNORMAL HIGH (ref 3.87–5.11)
RDW: 13.4 % (ref 11.5–15.5)
WBC: 17.2 10*3/uL — ABNORMAL HIGH (ref 4.0–10.5)

## 2016-12-04 LAB — TYPE AND SCREEN
ABO/RH(D): A POS
Antibody Screen: NEGATIVE

## 2016-12-04 LAB — COMPREHENSIVE METABOLIC PANEL
ALBUMIN: 3.8 g/dL (ref 3.5–5.0)
ALT: 15 U/L (ref 14–54)
AST: 17 U/L (ref 15–41)
Alkaline Phosphatase: 71 U/L (ref 38–126)
Anion gap: 6 (ref 5–15)
BUN: 7 mg/dL (ref 6–20)
CHLORIDE: 105 mmol/L (ref 101–111)
CO2: 27 mmol/L (ref 22–32)
Calcium: 9.2 mg/dL (ref 8.9–10.3)
Creatinine, Ser: 0.79 mg/dL (ref 0.44–1.00)
GFR calc Af Amer: >60 mL/min (ref >60)
GFR calc non Af Amer: >60 mL/min (ref >60)
GLUCOSE: 104 mg/dL — AB (ref 65–99)
POTASSIUM: 3.5 mmol/L (ref 3.5–5.1)
Sodium: 138 mmol/L (ref 135–145)
Total Bilirubin: 0.4 mg/dL (ref 0.3–1.2)
Total Protein: 6.8 g/dL (ref 6.5–8.1)

## 2016-12-04 LAB — ABO/RH: ABO/RH(D): A POS

## 2016-12-04 MED ORDER — DOCUSATE SODIUM 100 MG PO CAPS: 100.0000 mg | ORAL_CAPSULE | Freq: Two times a day (BID) | ORAL | 0 refills | Status: DC

## 2016-12-04 NOTE — ED Triage Notes (Signed)
Pt presents to ED for assessment after having 3 days of bloody bowel movements.  Pt c/o bright red blood filling the bowl, also noted some difficulty with passing gas and having blood in her underwear.  Pt c/o feeling "run down", weak.

## 2016-12-04 NOTE — ED Notes (Signed)
ED Provider at bedside. 

## 2016-12-04 NOTE — ED Provider Notes (Signed)
TIME SEEN: 5:30 AM  CHIEF COMPLAINT: Rectal bleeding  HPI: Pt is a 31 y.o. female with history of irritable bowel syndrome who presents to the emergency department complaints of rectal bleeding intermittent for the past 3 days. It is described as bright red bleeding without melena. She denies fevers, chills, nausea, vomiting, diarrhea. States she has had hard bowel movements and has had to strain to have bowel movements. Does have some pain with bowel movements. Not on anticoagulants or antiplatelets. She denies to me any abdominal pain at this time, rectal pain. No history of Crohn's disease or ulcerative colitis. States bleeding occurs with bowel movements and also without bowel movements. Notices blood in the toilet but no blood clots or blood mixed in her stool. Was seen in urgent care 2 days ago and was told she had hemorrhoids and was given Anusol and GI follow-up. States she called the gastroenterologist who told her it be "$200 up front just to be seen". States she cannot afford at this time and that is why she is in the emergency department. No history of endoscopy or colonoscopy. Patient is status post appendectomy.  ROS: See HPI Constitutional: no fever  Eyes: no drainage  ENT: no runny nose   Cardiovascular:  no chest pain  Resp: no SOB  GI: no vomiting GU: no dysuria Integumentary: no rash  Allergy: no hives  Musculoskeletal: no leg swelling  Neurological: no slurred speech ROS otherwise negative  PAST MEDICAL HISTORY/PAST SURGICAL HISTORY:  Past Medical History:  Diagnosis Date  . Anxiety   . Appendicitis    appendectomy 03/20/12  . IBS (irritable bowel syndrome)   . No pertinent past medical history     MEDICATIONS:  Prior to Admission medications   Medication Sig Start Date End Date Taking? Authorizing Provider  hydrocortisone (ANUSOL-HC) 25 MG suppository Place 1 suppository (25 mg total) rectally 2 (two) times daily. 12/02/16   Hayden Rasmussenavid Mabe, NP    ALLERGIES:   Allergies  Allergen Reactions  . Codeine Rash and Other (See Comments)    headache    SOCIAL HISTORY:  Social History  Substance Use Topics  . Smoking status: Current Every Day Smoker    Packs/day: 1.00    Types: Cigarettes  . Smokeless tobacco: Never Used  . Alcohol use Yes     Comment: occ    FAMILY HISTORY: History reviewed. No pertinent family history.  EXAM: BP 112/89   Pulse 100   Temp 98.5 F (36.9 C) (Oral)   Resp 18   LMP 11/25/2016 (LMP Unknown)   SpO2 97%  CONSTITUTIONAL: Alert and oriented and responds appropriately to questions. Well-appearing; well-nourished, Afebrile, nontoxic, in no distress, appears comfortable HEAD: Normocephalic EYES: Conjunctivae clear, PERRL, EOMI ENT: normal nose; no rhinorrhea; moist mucous membranes NECK: Supple, no meningismus, no nuchal rigidity, no LAD  CARD: RRR; S1 and S2 appreciated; no murmurs, no clicks, no rubs, no gallops RESP: Normal chest excursion without splinting or tachypnea; breath sounds clear and equal bilaterally; no wheezes, no rhonchi, no rales, no hypoxia or respiratory distress, speaking full sentences ABD/GI: Normal bowel sounds; non-distended; soft, non-tender, no rebound, no guarding, no peritoneal signs, no hepatosplenomegaly RECTAL:  Normal rectal tone, no gross blood or melena, guaiac negative, nontender rectal exam, no fecal impaction; slightly hard stool in the rectal vault, multiple nonthrombosed external hemorrhoids that are not actively bleeding BACK:  The back appears normal and is non-tender to palpation, there is no CVA tenderness EXT: Normal ROM in all joints;  non-tender to palpation; no edema; normal capillary refill; no cyanosis, no calf tenderness or swelling    SKIN: Normal color for age and race; warm; no rash NEURO: Moves all extremities equally, sensation to light touch intact diffusely, cranial nerves II through XII intact, normal speech PSYCH: The patient's mood and manner are  appropriate. Grooming and personal hygiene are appropriate.  MEDICAL DECISION MAKING: Patient here with complaints of rectal bleeding. She is not currently bleeding. Hemoglobin is 13.9. Hemodynamically stable. Abdominal exam benign. She is not on anticoagulation or antiplatelets. She does have a leukocytosis without any recent infectious symptoms. This may be reactive. No signs of infection currently. I discussed with her that I do not feel she needs admission, blood transfusion, CT imaging at this time. Have recommended close follow-up with a gastroenterologist as an outpatient for an outpatient colonoscopy. Recommend she take Colace to soften her stools and increase her water and fiber intake at home. I did discuss with her at length return precautions including worsening bleeding, lightheadedness, chest pain or shortness of breath, abdominal pain, melena. She verbalizes understanding and is comfortable with this plan.  At this time, I do not feel there is any life-threatening condition present. I have reviewed and discussed all results (EKG, imaging, lab, urine as appropriate) and exam findings with patient/family. I have reviewed nursing notes and appropriate previous records.  I feel the patient is safe to be discharged home without further emergent workup and can continue workup as an outpatient as needed. Discussed usual and customary return precautions. Patient/family verbalize understanding and are comfortable with this plan.  Outpatient follow-up has been provided. All questions have been answered.      Cindy Maw Ward, DO 12/04/16 [phone]

## 2016-12-04 NOTE — Discharge Instructions (Signed)
Please take Colace 100 mg twice a day to keep your stool soft  and to keep you from straining. I recommend you increase your water and fiber intake at home. You need to follow-up with an outpatient gastroenterologist for further evaluation and possible colonoscopy.

## 2016-12-07 LAB — POC OCCULT BLOOD, ED: FECAL OCCULT BLD: NEGATIVE

## 2017-10-03 ENCOUNTER — Ambulatory Visit (HOSPITAL_COMMUNITY)
Admission: EM | Admit: 2017-10-03 | Discharge: 2017-10-03 | Disposition: A | Discharge status: Home / Self Care | Payer: Medicaid Other | Attending: Emergency Medicine | Admitting: Emergency Medicine

## 2017-10-03 ENCOUNTER — Other Ambulatory Visit: Payer: Self-pay

## 2017-10-03 ENCOUNTER — Encounter (HOSPITAL_COMMUNITY): Payer: Self-pay | Admitting: Emergency Medicine

## 2017-10-03 DIAGNOSIS — H60502 Unspecified acute noninfective otitis externa, left ear: Secondary | ICD-10-CM

## 2017-10-03 DIAGNOSIS — H66005 Acute suppurative otitis media without spontaneous rupture of ear drum, recurrent, left ear: Secondary | ICD-10-CM

## 2017-10-03 MED ORDER — FLUTICASONE PROPIONATE 50 MCG/ACT NA SUSP: 2.0000 | Freq: Every day | NASAL | 0 refills | Status: AC

## 2017-10-03 MED ORDER — CIPROFLOXACIN HCL 0.2 % OT SOLN: 0.2000 mL | Freq: Two times a day (BID) | OTIC | 0 refills | Status: AC

## 2017-10-03 MED ORDER — AMOXICILLIN 500 MG PO CAPS: 500.0000 mg | ORAL_CAPSULE | Freq: Two times a day (BID) | ORAL | 0 refills | Status: AC

## 2017-10-03 NOTE — ED Triage Notes (Signed)
Pt c/o ear pain in L ear.

## 2017-10-03 NOTE — Discharge Instructions (Signed)
Start amoxicillin and ciprofloxacin ear drops as directed. Over the counter flonase can help with eustachian tube dysfunction that may be causing trouble draining your ear. Follow up with PCP/ENT for further evaluation/management needed.

## 2017-10-03 NOTE — ED Provider Notes (Signed)
[facility]    CSN: [id number] Arrival date & time: 10/03/17  1634     History   Chief Complaint Chief Complaint  Patient presents with  . Otalgia    HPI Cindy Livingston is a 31 y.o. female.   31 year old female with recurrent otitis media comes in for evaluation. States she usually gets otitis media every year or so. Current episode has been for the past 2 weeks, she was waiting for medicaid to kick in for evaluation. She states in the past, she was put on cipro otic drops for symptoms when TM has ruptured. She has noticed increased drainage to believe that TM might have ruptured. Denies fever, chills, night sweats. Denies URI symptoms such as cough, congestion, sore throat. Has not had any antibiotics for the past month.       Past Medical History:  Diagnosis Date  . Anxiety   . Appendicitis    appendectomy 03/20/12  . IBS (irritable bowel syndrome)   . No pertinent past medical history     There are no active problems to display for this patient.   Past Surgical History:  Procedure Laterality Date  . APPENDECTOMY  03/20/2012  . CESAREAN SECTION  2004, 2007   x 2   . TYMPANOPLASTY Left 1992 and 1995    OB History    Gravida Para Term Preterm AB Living   2 2 1 1   3    SAB TAB Ectopic Multiple Live Births         1 3       Home Medications    Prior to Admission medications   Medication Sig Start Date End Date Taking? Authorizing Provider  amoxicillin (AMOXIL) 500 MG capsule Take 1 capsule (500 mg total) 2 (two) times daily for 7 days by mouth. 10/03/17 10/10/17  Cathie HoopsYu, Merek Niu V, PA-C  Ciprofloxacin HCl 0.2 % otic solution Place 0.2 mLs 2 (two) times daily for 7 days into the left ear. 10/03/17 10/10/17  Cathie HoopsYu, Jovonta Levit V, PA-C  fluticasone (FLONASE) 50 MCG/ACT nasal spray Place 2 sprays daily into both nostrils. 10/03/17   Belinda FisherYu, Makaylia Hewett V, PA-C    Family History No family history on file.  Social History Social History   Tobacco Use  . Smoking status:  Current Every Day Smoker    Packs/day: 1.00    Types: Cigarettes  . Smokeless tobacco: Never Used  Substance Use Topics  . Alcohol use: Yes    Comment: occ  . Drug use: Yes    Types: Marijuana    Comment: once a day     Allergies   Codeine   Review of Systems Review of Systems  Reason unable to perform ROS: See HPI as above.     Physical Exam Triage Vital Signs ED Triage Vitals  Enc Vitals Group     BP 10/03/17 1645 111/74     Pulse Rate 10/03/17 1645 93     Resp 10/03/17 1645 14     Temp 10/03/17 1645 98.1 F (36.7 C)     Temp src --      SpO2 10/03/17 1645 100 %     Weight --      Height --      Head Circumference --      Peak Flow --      Pain Score 10/03/17 1646 0     Pain Loc --      Pain Edu? --      Excl.  in GC? --    No data found.  Updated Vital Signs BP 111/74   Pulse 93   Temp 98.1 F (36.7 C)   Resp 14   LMP 10/03/2017   SpO2 100%   Physical Exam  Constitutional: She is oriented to person, place, and time. She appears well-developed and well-nourished. No distress.  HENT:  Head: Normocephalic and atraumatic.  Right Ear: Tympanic membrane, external ear and ear canal normal. Tympanic membrane is not perforated, not retracted and not bulging.  Left Ear: External ear and ear canal normal. Tympanic membrane is erythematous and bulging. Tympanic membrane is not perforated.  No tenderness on palpation of the tragus. No drainage seen in the ear canal.   Eyes: Conjunctivae are normal. Pupils are equal, round, and reactive to light.  Neurological: She is alert and oriented to person, place, and time.     UC Treatments / Results  Labs (all labs ordered are listed, but only abnormal results are displayed) Labs Reviewed - No data to display  EKG  EKG Interpretation None       Radiology No results found.  Procedures Procedures (including critical care time)  Medications Ordered in UC Medications - No data to display   Initial  Impression / Assessment and Plan / UC Course  I have reviewed the triage vital signs and the nursing notes.  Pertinent labs & imaging results that were available during my care of the patient were reviewed by me and considered in my medical decision making (see chart for details).    Amoxicillin and ciprofloxacin for otitis media and possible externa. Flonase for possible eustachian tube dysfunction. Follow up with PCP/ENT for further evaluation needed.   Final Clinical Impressions(s) / UC Diagnoses   Final diagnoses:  Recurrent acute suppurative otitis media without spontaneous rupture of left tympanic membrane  Acute otitis externa of left ear, unspecified type    ED Discharge Orders        Ordered    amoxicillin (AMOXIL) 500 MG capsule  2 times daily     10/03/17 1750    Ciprofloxacin HCl 0.2 % otic solution  2 times daily     10/03/17 1750    fluticasone (FLONASE) 50 MCG/ACT nasal spray  Daily     10/03/17 1751        Belinda FisherYu, Adaisha Campise V, PA-C 10/03/17 1758

## 2017-10-16 ENCOUNTER — Encounter (HOSPITAL_COMMUNITY): Payer: Self-pay | Admitting: Emergency Medicine

## 2017-10-16 ENCOUNTER — Other Ambulatory Visit: Payer: Self-pay

## 2017-10-16 ENCOUNTER — Ambulatory Visit (HOSPITAL_COMMUNITY)
Admission: EM | Admit: 2017-10-16 | Discharge: 2017-10-16 | Disposition: A | Discharge status: Home / Self Care | Payer: Medicaid Other | Attending: Family Medicine | Admitting: Family Medicine

## 2017-10-16 DIAGNOSIS — H65192 Other acute nonsuppurative otitis media, left ear: Secondary | ICD-10-CM

## 2017-10-16 MED ORDER — NEOMYCIN-POLYMYXIN-HC 3.5-10000-1 OT SUSP: 4.0000 [drp] | Freq: Three times a day (TID) | OTIC | 3 refills | Status: DC

## 2017-10-16 NOTE — ED Triage Notes (Signed)
Pt seen here 1.5 weeks ago for ear infection, given antibiotics and it clear up, now draining again. In L ear.

## 2017-10-16 NOTE — ED Provider Notes (Signed)
[facility]   [id number] 10/16/17 Arrival Time: 1254   SUBJECTIVE:  Cindy Livingston is a 31 y.o. female who presents to the urgent care with complaint of left otorrhea.  Treated for otitis media 10 days ago.  Two prior ear surgeries in that ear as a child.   Past Medical History:  Diagnosis Date  . Anxiety   . Appendicitis    appendectomy 03/20/12  . IBS (irritable bowel syndrome)   . No pertinent past medical history    History reviewed. No pertinent family history. Social History   Socioeconomic History  . Marital status: Married    Spouse name: Not on file  . Number of children: Not on file  . Years of education: Not on file  . Highest education level: Not on file  Social Needs  . Financial resource strain: Not on file  . Food insecurity - worry: Not on file  . Food insecurity - inability: Not on file  . Transportation needs - medical: Not on file  . Transportation needs - non-medical: Not on file  Occupational History  . Not on file  Tobacco Use  . Smoking status: Current Every Day Smoker    Packs/day: 1.00    Types: Cigarettes  . Smokeless tobacco: Never Used  Substance and Sexual Activity  . Alcohol use: Yes    Comment: occ  . Drug use: Yes    Types: Marijuana    Comment: once a day  . Sexual activity: Yes    Birth control/protection: None  Other Topics Concern  . Not on file  Social History Narrative  . Not on file   No outpatient medications have been marked as taking for the 10/16/17 encounter [facility](Hospital Encounter).   Allergies  Allergen Reactions  . Codeine Rash and Other (See Comments)    headache      ROS: As per HPI, remainder of ROS negative.   OBJECTIVE:   Vitals:   10/16/17 1354  BP: 110/74  Pulse: 88  Resp: 14  Temp: 98.3 F (36.8 C)  SpO2: 100%     General appearance: alert; no distress Eyes: PERRL; EOMI;  HENT: normocephalic; atraumatic; TMs 3 mm red granulation tissue inferior posterior left TM, canal  normal, external ears normal without trauma; nasal mucosa normal; oral mucosa normal Neck: supple Back: no CVA tenderness Extremities: no cyanosis or edema; symmetrical with no gross deformities Skin: warm and dry Neurologic: normal gait; grossly normal Psychological: alert and cooperative; normal mood and affect      Labs:  Results for orders placed or performed during the hospital encounter of 12/04/16  Comprehensive metabolic panel  Result Value Ref Range   Sodium 138 135 - 145 mmol/L   Potassium 3.5 3.5 - 5.1 mmol/L   Chloride 105 101 - 111 mmol/L   CO2 27 22 - 32 mmol/L   Glucose, Bld 104 (H) 65 - 99 mg/dL   BUN 7 6 - 20 mg/dL   Creatinine, Ser 4.090.79 0.44 - 1.00 mg/dL   Calcium 9.2 8.9 - 81.110.3 mg/dL   Total Protein 6.8 6.5 - 8.1 g/dL   Albumin 3.8 3.5 - 5.0 g/dL   AST 17 15 - 41 U/L   ALT 15 14 - 54 U/L   Alkaline Phosphatase 71 38 - 126 U/L   Total Bilirubin 0.4 0.3 - 1.2 mg/dL   GFR calc non Af Amer >60 >60 mL/min   GFR calc Af Amer >60 >60 mL/min   Anion gap 6 5 -  15  CBC  Result Value Ref Range   WBC 17.2 (H) 4.0 - 10.5 K/uL   RBC 5.26 (H) 3.87 - 5.11 MIL/uL   Hemoglobin 13.9 12.0 - 15.0 g/dL   HCT 91.441.7 78.236.0 - 95.646.0 %   MCV 79.3 78.0 - 100.0 fL   MCH 26.4 26.0 - 34.0 pg   MCHC 33.3 30.0 - 36.0 g/dL   RDW 21.313.4 08.611.5 - 57.815.5 %   Platelets 322 150 - 400 K/uL  POC occult blood, ED  Result Value Ref Range   Fecal Occult Bld NEGATIVE NEGATIVE  Type and screen [facility]  Result Value Ref Range   ABO/RH(D) A POS    Antibody Screen NEG    Sample Expiration 12/07/2016   ABO/Rh  Result Value Ref Range   ABO/RH(D) A POS     Labs Reviewed - No data to display  No results found.     ASSESSMENT & PLAN:  1. Other acute nonsuppurative otitis media of left ear, recurrence not specified     Meds ordered this encounter  Medications  . neomycin-polymyxin-hydrocortisone (CORTISPORIN) 3.5-10000-1 OTIC suspension    Sig: Place 4 drops into the  left ear 3 (three) times daily.    Dispense:  10 mL    Refill:  3    Reviewed expectations re: course of current medical issues. Questions answered. Outlined signs and symptoms indicating need for more acute intervention. Patient verbalized understanding. After Visit Summary given.     Elvina SidleLauenstein, Jacen Carlini, MD 10/16/17 1404

## 2017-10-16 NOTE — Discharge Instructions (Signed)
You will need follow up by ENT specialist for this.  Stay on the drops until you see him/her.  It looks like you are developing a cholesteatoma.

## 2017-11-17 ENCOUNTER — Encounter (HOSPITAL_COMMUNITY): Payer: Self-pay | Admitting: *Deleted

## 2017-11-17 ENCOUNTER — Other Ambulatory Visit: Payer: Self-pay | Admitting: Otolaryngology

## 2017-11-17 ENCOUNTER — Inpatient Hospital Stay (HOSPITAL_COMMUNITY)
Admission: AD | Admit: 2017-11-17 | Discharge: 2017-11-18 | Disposition: A | Discharge status: Home / Self Care | Payer: Medicaid Other | Source: Ambulatory Visit | Attending: Family Medicine | Admitting: Family Medicine

## 2017-11-17 DIAGNOSIS — R102 Pelvic and perineal pain: Secondary | ICD-10-CM | POA: Diagnosis present

## 2017-11-17 DIAGNOSIS — F17210 Nicotine dependence, cigarettes, uncomplicated: Secondary | ICD-10-CM | POA: Insufficient documentation

## 2017-11-17 DIAGNOSIS — H7292 Unspecified perforation of tympanic membrane, left ear: Secondary | ICD-10-CM

## 2017-11-17 DIAGNOSIS — R300 Dysuria: Secondary | ICD-10-CM

## 2017-11-17 DIAGNOSIS — N390 Urinary tract infection, site not specified: Secondary | ICD-10-CM | POA: Diagnosis not present

## 2017-11-17 DIAGNOSIS — Z885 Allergy status to narcotic agent status: Secondary | ICD-10-CM | POA: Diagnosis not present

## 2017-11-17 DIAGNOSIS — R8281 Pyuria: Secondary | ICD-10-CM

## 2017-11-17 LAB — POCT PREGNANCY, URINE: PREG TEST UR: NEGATIVE

## 2017-11-17 NOTE — MAU Note (Signed)
PT SAYS  SHE THINKS  SHE HAS AN INFECTION- ITCHES,  FEELS  SWOLLEN,  BURNS  WITH VOIDING. .   NO BIRTH CONTROL.  LAST SEX- YESTERDAY

## 2017-11-18 DIAGNOSIS — R300 Dysuria: Secondary | ICD-10-CM

## 2017-11-18 LAB — WET PREP, GENITAL
Clue Cells Wet Prep HPF POC: NONE SEEN
SPERM: NONE SEEN
Trich, Wet Prep: NONE SEEN
YEAST WET PREP: NONE SEEN

## 2017-11-18 LAB — URINALYSIS, ROUTINE W REFLEX MICROSCOPIC
BILIRUBIN URINE: NEGATIVE
GLUCOSE, UA: NEGATIVE mg/dL
HGB URINE DIPSTICK: NEGATIVE
Ketones, ur: NEGATIVE mg/dL
NITRITE: NEGATIVE
Protein, ur: NEGATIVE mg/dL
Specific Gravity, Urine: 1.013 (ref 1.005–1.030)
pH: 6 (ref 5.0–8.0)

## 2017-11-18 MED ORDER — SULFAMETHOXAZOLE-TRIMETHOPRIM 800-160 MG PO TABS: 1.0000 | ORAL_TABLET | Freq: Two times a day (BID) | ORAL | 0 refills | Status: AC

## 2017-11-18 NOTE — MAU Provider Note (Signed)
History  CSN: [id number] Arrival date and time: 11/17/17 2317  First Provider Initiated Contact with Patient 11/18/17 0422      Chief Complaint  Patient presents with  . Pelvic Pain    HPI: Cindy Livingston is a 31 y.o. [id number] who presents to maternity admissions reporting vaginal irritation and dysuria. She reports that for the last two days she has had some pain in her vagina, worse with urination. Now also having some dysuria and urinary frequency. Reports noticing blood as well when she wipes. Of note, her menses ended 2 days ago. Denies abnormal vaginal discharge,fevers, chills, malaise, or flank pain.   OB History  Gravida Para Term Preterm AB Living  2 2 1 1   3   SAB TAB Ectopic Multiple Live Births        1 3    # Outcome Date GA Lbr Len/2nd Weight Sex Delivery Anes PTL Lv  2 Term     M CS-LTranv   LIV  1A Preterm     F CS-LTranv   LIV  1B Preterm     F CS-LTranv   LIV     Past Medical History:  Diagnosis Date  . Anxiety   . Appendicitis    appendectomy 03/20/12  . IBS (irritable bowel syndrome)   . No pertinent past medical history    Past Surgical History:  Procedure Laterality Date  . APPENDECTOMY  03/20/2012  . CESAREAN SECTION  2004, 2007   x 2   . LAPAROSCOPIC APPENDECTOMY  03/20/2012   Procedure: APPENDECTOMY LAPAROSCOPIC;  Surgeon: Lodema PilotBrian Layton, DO;  Location: [facility] OR;  Service: General;  Laterality: N/A;  Lap. App.  . TYMPANOPLASTY Left 1992 and 1995   Social History   Socioeconomic History  . Marital status: Married    Spouse name: Not on file  . Number of children: Not on file  . Years of education: Not on file  . Highest education level: Not on file  Social Needs  . Financial resource strain: Not on file  . Food insecurity - worry: Not on file  . Food insecurity - inability: Not on file  . Transportation needs - medical: Not on file  . Transportation needs - non-medical: Not on file  Occupational History  . Not on file  Tobacco Use  .  Smoking status: Current Every Day Smoker    Packs/day: 1.00    Types: Cigarettes  . Smokeless tobacco: Never Used  Substance and Sexual Activity  . Alcohol use: Yes    Comment: occ  . Drug use: Yes    Types: Marijuana    Comment: once a day  . Sexual activity: Yes    Birth control/protection: None  Other Topics Concern  . Not on file  Social History Narrative  . Not on file   Allergies  Allergen Reactions  . Codeine Rash and Other (See Comments)    headache    Medications Prior to Admission  Medication Sig Dispense Refill Last Dose  . fluticasone (FLONASE) 50 MCG/ACT nasal spray Place 2 sprays daily into both nostrils. 1 g 0   . neomycin-polymyxin-hydrocortisone (CORTISPORIN) 3.5-10000-1 OTIC suspension Place 4 drops into the left ear 3 (three) times daily. 10 mL 3     I have reviewed patient's Past Medical Hx, Surgical Hx, Family Hx, Social Hx, medications and allergies.   Review of Systems: Negative except for what is mentioned in HPI.  Physical Exam   Blood pressure 111/72, pulse 93, temperature 98.4 F (  36.9 C), temperature source Oral, height 5\' 9"  (1.753 m), weight 188 lb 4 oz (85.4 kg), last menstrual period 11/14/2017.  Constitutional: Well-developed, well-nourished female in no acute distress.  HENT: [place]/AT, normal oropharynx mucosa. MMM Eyes: normal conjunctivae, no scleral icterus Cardiovascular: normal rate Respiratory: normal effort,  GI: Abd soft, non-tender, non-distended, normoactive bowel sounds GU: Neg CVAT. Pelvic: NEFG. Normal vaginal mucosa without lesions; cervix pink, without lesion; scant vaginal discharge. Neg CMT, uterus nontender, nonenlarged, adnexa without tenderness, enlargement, or mass MSK: Extremities nontender, no edema Neurologic: Alert and oriented x 4. Psych: Normal mood and affect Skin: warm and dry    MAU Course/MDM:   Nursing notes and VS reviewed. Patient seen and examined, as noted above.  Neg UPT UA with large  leukocytes, TNTC WBCs. Will send urine for culture  Wet prep with few WBCs, otherwise negative GC/Chlamydia probe sent  Assessment and Plan  Assessment: 1. Dysuria   2. Pyuria     Plan: --Will treat with 3-day course of Bactrim for simple cystitis. --Urine culture --Discharge home in stable condition.   Nicoles Sedlacek, Kandra NicolasJulie P, MD 11/18/2017 5:29 AM

## 2017-11-18 NOTE — Discharge Instructions (Signed)
In late 2019, the [facility] will be moving to the [facility] campus. At that time, the MAU (Maternity Admissions Unit), where you are being seen today, will no longer take care of non-pregnant patients. We strongly encourage you to find a doctor's office before that time, so that you can be seen with any GYN concerns, like vaginal discharge, urinary tract infection, etc.. in a timely manner.  In order to make an office visit more convenient, the Center for [facility] at [facility] will be offering evening hours with same-day appointments, walk-in appointments and scheduled appointments available during this time.  Center for [facility] @ [facility] Hours: Monday - 8am - 7:30 pm with walk-in between 4pm- 7:30 pm Tuesday - 8 am - 5 pm (starting 02/21/18 we will be open late and accepting walk-ins from 4pm - 7:30pm) Wednesday - 8 am - 5 pm (starting 05/24/18 we will be open late and accepting walk-ins from 4pm - 7:30pm) Thursday 8 am - 5 pm (starting 08/24/18 we will be open late and accepting walk-ins from 4pm - 7:30pm) Friday 8 am - 5 pm  For an appointment please call the Center for [facility] @ [facility] at [phone]  For urgent needs, [facility] is also available for management of urgent GYN complaints such as vaginal discharge or urinary tract infections.    Antibiotic Medicine, Adult Antibiotic medicines treat infections caused by a type of germ called bacteria. They work by killing the bacteria that make you sick. When do I need to take antibiotics? You often need these medicines to treat bacterial infections, such as:  A urinary tract infection (UTI).  Strep throat.  Meningitis. This affects the spinal cord and brain.  A bad lung infection.  You may start the medicines while your doctor waits for tests to come back. When the tests come back, your doctor may change or stop your medicine. When are antibiotics not  needed? You do not need these medicines for most common illnesses, such as:  A cold.  The flu.  A sore throat.  Antibiotics are not always needed for all infections caused by bacteria. Do not ask for these medicines, or take them, when they are not needed. What are the risks of taking antibiotics? Most antibiotics can cause an infection called Clostridium difficile.This causes watery poop (diarrhea). Let your doctor know right away if:  You have watery poop while taking an antibiotic.  You have watery poop after you stop taking an antibiotic. The illness can happen weeks after you stop the medicine.  You also have a risk of getting an infection in the future that antibiotics cannot treat (antibiotic-resistant infection). This type of infection can be dangerous. What else should I know about taking antibiotics?  You need to take the entire prescription. ? Take the medicine for as long as told by your doctor. ? Do not stop taking it even if you start to feel better.  Try not to miss any doses. If you miss a dose, call your doctor.  Birth control pills may not work. If you take birth control pills: ? Keep on taking them. ? Use a second form of birth control, such as a condom. Do this for as long as told by your doctor.  Ask your doctor: ? How long to wait in between doses. ? If you should take the medicine with food. ? If there is anything you should stay away from while taking the antibiotic, such  as: ? Food. ? Drinks. ? Medicines. ? If there are any side effects you should watch for.  Only take the medicines that your doctor told you to take. Do not take medicines that were given to someone else.  Drink a large glass of water with the medicine.  Ask the pharmacist for a tool to measure the medicine, such as: ? A syringe. ? A cup. ? A spoon.  Throw away any extra medicine. Contact a doctor if:  You get worse.  You have new joint pain or muscle aches after starting  the medicine.  You have side effects from the medicine, such as: ? Stomach pain. ? Watery poop. ? Feeling sick to your stomach (nausea). Get help right away if:  You have signs of a very bad allergic reaction. If this happens, stop taking the medicine right away. Signs may include: ? Hives. These are raised, itchy, red bumps on the skin. ? Skin rash. ? Trouble breathing. ? Wheezing. ? Swelling. ? Feeling dizzy. ? Throwing up (vomiting).  Your pee (urine) is dark, or is the color of blood.  Your skin turns yellow.  You bruise easily.  You bleed easily.  You have very bad watery poop and cramps in your belly.  You have a very bad headache. Summary  Antibiotics are often used to treat infections caused by bacteria.  Only take these medicines when needed.  Let your doctor know if you have watery poop while taking an antibiotic.  You need to take the entire prescription. This information is not intended to replace advice given to you by your health care provider. Make sure you discuss any questions you have with your health care provider. Document Released: 08/17/2008 Document Revised: 11/10/2016 Document Reviewed: 11/10/2016 Elsevier Interactive Patient Education  2017 [employer].

## 2017-11-19 LAB — URINE CULTURE: Culture: NO GROWTH

## 2017-11-20 ENCOUNTER — Emergency Department (HOSPITAL_BASED_OUTPATIENT_CLINIC_OR_DEPARTMENT_OTHER)
Admission: EM | Admit: 2017-11-20 | Discharge: 2017-11-21 | Disposition: A | Discharge status: Home / Self Care | Payer: Medicaid Other | Attending: Emergency Medicine | Admitting: Emergency Medicine

## 2017-11-20 ENCOUNTER — Encounter (HOSPITAL_BASED_OUTPATIENT_CLINIC_OR_DEPARTMENT_OTHER): Payer: Self-pay | Admitting: Emergency Medicine

## 2017-11-20 ENCOUNTER — Telehealth: Payer: Self-pay | Admitting: Certified Nurse Midwife

## 2017-11-20 ENCOUNTER — Other Ambulatory Visit: Payer: Self-pay

## 2017-11-20 DIAGNOSIS — A599 Trichomoniasis, unspecified: Secondary | ICD-10-CM | POA: Diagnosis not present

## 2017-11-20 DIAGNOSIS — Z202 Contact with and (suspected) exposure to infections with a predominantly sexual mode of transmission: Secondary | ICD-10-CM | POA: Diagnosis not present

## 2017-11-20 DIAGNOSIS — Z79899 Other long term (current) drug therapy: Secondary | ICD-10-CM | POA: Insufficient documentation

## 2017-11-20 DIAGNOSIS — N898 Other specified noninflammatory disorders of vagina: Secondary | ICD-10-CM | POA: Diagnosis present

## 2017-11-20 DIAGNOSIS — F17210 Nicotine dependence, cigarettes, uncomplicated: Secondary | ICD-10-CM | POA: Diagnosis not present

## 2017-11-20 LAB — WET PREP, GENITAL
CLUE CELLS WET PREP: NONE SEEN
Sperm: NONE SEEN
YEAST WET PREP: NONE SEEN

## 2017-11-20 LAB — URINALYSIS, ROUTINE W REFLEX MICROSCOPIC
BILIRUBIN URINE: NEGATIVE
GLUCOSE, UA: NEGATIVE mg/dL
Ketones, ur: 15 mg/dL — AB
Nitrite: NEGATIVE
PH: 6 (ref 5.0–8.0)
Protein, ur: 100 mg/dL — AB

## 2017-11-20 LAB — PREGNANCY, URINE: Preg Test, Ur: NEGATIVE

## 2017-11-20 LAB — URINALYSIS, MICROSCOPIC (REFLEX)

## 2017-11-20 MED ORDER — METRONIDAZOLE 500 MG PO TABS
2000.0000 mg | ORAL_TABLET | Freq: Once | ORAL | Status: AC
  Administered 2017-11-20: 2000 mg via ORAL
  Filled 2017-11-20: qty 4

## 2017-11-20 MED ORDER — AZITHROMYCIN 250 MG PO TABS
1000.0000 mg | ORAL_TABLET | Freq: Once | ORAL | Status: AC
  Administered 2017-11-20: 1000 mg via ORAL
  Filled 2017-11-20: qty 4

## 2017-11-20 MED ORDER — CEFTRIAXONE SODIUM 250 MG IJ SOLR
250.0000 mg | Freq: Once | INTRAMUSCULAR | Status: AC
  Administered 2017-11-20: 250 mg via INTRAMUSCULAR
  Filled 2017-11-20: qty 250

## 2017-11-20 NOTE — ED Provider Notes (Signed)
[facility] EMERGENCY DEPARTMENT Provider Note   CSN: [id number] Arrival date & time: 11/20/17  2059     History   Chief Complaint No chief complaint on file.   HPI Cindy Livingston is a 31 y.o. female with no significant past medical history who presents the emergency department today for urinary symptoms.  She was seen on 11/17/2017 for vaginal irritation, vaginal itching, dysuria and urinary frequency.  She also noted that there was some blood when she wipes.  Prior providers pelvic exam was reassuring in his note.  She was treated with a 3-day course of Bactrim for simple cystitis.  Urine was sent for culture and did not show any growth.  Gonorrhea and Chlamydia probes were sent.  She is presenting today for continued symptoms.  She has finished her antibiotics without any relief.  Her symptoms are no better or worse.  She has not tried anything else for her symptoms.  She denies any fever, abdominal pain, nausea, vomiting, flank pain, vaginal bleeding, vaginal discharge.  Patient is sexually active with one female partner over the last 3 months.  She denies using protection.  Last menstrual period 11/15/2017.  HPI  Past Medical History:  Diagnosis Date  . Anxiety   . Appendicitis    appendectomy 03/20/12  . IBS (irritable bowel syndrome)   . No pertinent past medical history     There are no active problems to display for this patient.   Past Surgical History:  Procedure Laterality Date  . APPENDECTOMY  03/20/2012  . CESAREAN SECTION  2004, 2007   x 2   . LAPAROSCOPIC APPENDECTOMY  03/20/2012   Procedure: APPENDECTOMY LAPAROSCOPIC;  Surgeon: [name], DO;  Location: [facility] OR;  Service: General;  Laterality: N/A;  Lap. App.  . TYMPANOPLASTY Left 1992 and 1995    OB History    Gravida Para Term Preterm AB Living   2 2 1 1   3    SAB TAB Ectopic Multiple Live Births         1 3       Home Medications    Prior to Admission medications   Medication Sig  Start Date End Date Taking? Authorizing Provider  fluticasone (FLONASE) 50 MCG/ACT nasal spray Place 2 sprays daily into both nostrils. 10/03/17   Cathie Hoops, Amy V, PA-C  neomycin-polymyxin-hydrocortisone (CORTISPORIN) 3.5-10000-1 OTIC suspension Place 4 drops into the left ear 3 (three) times daily. 10/16/17   Elvina Sidle, MD  sulfamethoxazole-trimethoprim (BACTRIM DS,SEPTRA DS) 800-160 MG tablet Take 1 tablet by mouth 2 (two) times daily for 3 days. 11/18/17 11/21/17  Degele, Kandra Nicolas, MD    Family History History reviewed. No pertinent family history.  Social History Social History   Tobacco Use  . Smoking status: Current Every Day Smoker    Packs/day: 1.00    Types: Cigarettes  . Smokeless tobacco: Never Used  Substance Use Topics  . Alcohol use: Yes    Comment: occ  . Drug use: Yes    Types: Marijuana    Comment: once a day     Allergies   Codeine   Review of Systems Review of Systems  All other systems reviewed and are negative.    Physical Exam Updated Vital Signs BP 112/76 (BP Location: Left Arm)   Pulse (!) 103   Temp 99.3 F (37.4 C) (Oral)   Resp 18   Ht 5\' 9"  (1.753 m)   Wt 85.3 kg (188 lb)   LMP 11/14/2017  SpO2 100%   BMI 27.76 kg/m   Physical Exam  Constitutional: She appears well-developed and well-nourished.  HENT:  Head: Normocephalic and atraumatic.  Right Ear: External ear normal.  Left Ear: External ear normal.  Nose: Nose normal.  Mouth/Throat: Uvula is midline, oropharynx is clear and moist and mucous membranes are normal. No tonsillar exudate.  Eyes: Pupils are equal, round, and reactive to light. Right eye exhibits no discharge. Left eye exhibits no discharge. No scleral icterus.  Neck: Trachea normal. Neck supple. No spinous process tenderness present. No neck rigidity. Normal range of motion present.  Cardiovascular: Normal rate, regular rhythm and intact distal pulses.  No murmur heard. Pulses:      Radial pulses are 2+ on the  right side, and 2+ on the left side.       Dorsalis pedis pulses are 2+ on the right side, and 2+ on the left side.       Posterior tibial pulses are 2+ on the right side, and 2+ on the left side.  No lower extremity swelling or edema. Calves symmetric in size bilaterally.  Pulmonary/Chest: Effort normal and breath sounds normal. She exhibits no tenderness.  Abdominal: Soft. Bowel sounds are normal. There is no tenderness. There is no rigidity, no rebound, no guarding and no CVA tenderness.  Genitourinary:  Genitourinary Comments: Exam performed by Jacinto HalimMichael M Jerusalem Brownstein, exam chaperoned Pelvic exam: normal external genitalia without evidence of trauma. VULVA: normal appearing vulva with no masses, tenderness or lesion. VAGINA: normal appearing vagina with normal color and discharge, no lesions. CERVIX: normal appearing cervix without lesions, cervical motion tenderness absent, cervical os closed with out purulent discharge; vaginal discharge - clear, white and thin, Wet prep and DNA probe for chlamydia and GC obtained.   ADNEXA: normal adnexa in size, nontender and no masses UTERUS: uterus is normal size, shape, consistency and nontender.   Musculoskeletal: She exhibits no edema.  Lymphadenopathy:    She has no cervical adenopathy.  Neurological: She is alert.  Skin: Skin is warm and dry. No rash noted. She is not diaphoretic.  Psychiatric: She has a normal mood and affect.  Nursing note and vitals reviewed.    ED Treatments / Results  Labs (all labs ordered are listed, but only abnormal results are displayed) Labs Reviewed  WET PREP, GENITAL - Abnormal; Notable for the following components:      Result Value   Trich, Wet Prep PRESENT (*)    WBC, Wet Prep HPF POC MANY (*)    All other components within normal limits  URINALYSIS, ROUTINE W REFLEX MICROSCOPIC - Abnormal; Notable for the following components:   Color, Urine AMBER (*)    APPearance CLOUDY (*)    Specific Gravity, Urine  >1.030 (*)    Hgb urine dipstick SMALL (*)    Ketones, ur 15 (*)    Protein, ur 100 (*)    Leukocytes, UA SMALL (*)    All other components within normal limits  URINALYSIS, MICROSCOPIC (REFLEX) - Abnormal; Notable for the following components:   Bacteria, UA MANY (*)    Squamous Epithelial / LPF 6-30 (*)    All other components within normal limits  PREGNANCY, URINE  GC/CHLAMYDIA PROBE AMP ([place]) NOT AT [facility]    EKG  EKG Interpretation None       Radiology No results found.  Procedures Procedures (including critical care time)  Medications Ordered in ED Medications  azithromycin (ZITHROMAX) tablet 1,000 mg (1,000 mg Oral Given 11/20/17  2318)  cefTRIAXone (ROCEPHIN) injection 250 mg (250 mg Intramuscular Given 11/20/17 2319)  metroNIDAZOLE (FLAGYL) tablet 2,000 mg (2,000 mg Oral Given 11/20/17 2317)     Initial Impression / Assessment and Plan / ED Course  I have reviewed the triage vital signs and the nursing notes.  Pertinent labs & imaging results that were available during my care of the patient were reviewed by me and considered in my medical decision making (see chart for details).     Is a 31 year old female presenting with urinary frequency, dysuria, vaginal itching and vaginal irritation.  Patient is without fever.  [facility] and treated for simple cystitis.  Her urine culture came back negative.  Her symptoms are no better or worse.  Urine still shows evidence of pyuria.  There is also evidence of trichomonas.  Pelvic exam not concerning for PID.  Patient was prophylactically treated for gonorrhea and chlamydia azithromycin and ceftriaxone.  She was also given 2 g of Flagyl for treatment of trichomonas.  Prep does not show evidence of BV or yeast infection.  Will have the patient follow with her OB/GYN.  Patient made aware that gonorrhea and Chlamydia cultures are pending and will resolve in 2-3 days.  Patient encouraged to follow-up  with the local health department for future STI checks.  Advised on safe sex practices and understand that she needs to notify sexual partners.  Return precautions discussed.  Patient appears safe for discharge.  Final Clinical Impressions(s) / ED Diagnoses   Final diagnoses:  Trichimoniasis  Exposure to STD    ED Discharge Orders    None       Princella PellegriniMaczis, Kallen Delatorre M, PA-C 11/21/17 0001    Linwood DibblesKnapp, Jon, MD 11/21/17 [phone]

## 2017-11-20 NOTE — ED Triage Notes (Addendum)
Patient reports burning with urination which began today.  Reports vaginal swelling since Thursday.  Reports blood on the toilet paper when she wipes.  Denies fever, back pain.  [facility]  States that she finished abx today.

## 2017-11-20 NOTE — Telephone Encounter (Signed)
Returned call to pt. Pt requesting GC/CT results. Notified these results are still pending, usually takes 3 days for results. She will be notified if it is positive and treatment will be arranged. Encouraged pt to set up mychart to get normal results faster. I also notified her the UC was negative and she could stop abx today. Encouraged to f/u with PCP or urgent care if sx persist. Verbalized understanding.

## 2017-11-20 NOTE — Discharge Instructions (Signed)
Please read and follow all provided instructions.  Today you were cultured for a sexually transmitted disease like chlamydia or gonorrhea. You have elected to be treated at this time as a precaution due to positive trichomoniasis test. You were also treated for trichomoniasis today as well. Attached handouts on these are provided. Results of your gonorrhea and chlamydia tests are pending and you will be notified if they are positive. Please refrain from sexual activity for 48 hours  It is very important to practice safe sex and use condoms when sexually active. If the test is positive, please refrain from sexual activity for 10 days for the medicine to take effect and notify your sexual partners for the last 6 months as they, too, may want to be tested. The website https://garcia.net/http://www.dontspreadit.com/ can be used to send anonymous text messages or emails to alert sexual contacts.  Because you have had unprotected sex, you may want to consider getting tested for HIV (and Syphilis) as well. If you did not elect to be tested for HIV or Syphilis (blood tests) in the department today, you can follow up at the Health Department for further testing for this. Remember that latex condoms or abstinence are the only way to prevent against STDs or HIV.  If you should develop severe or worsening pain in your abdomen or the pelvis or develop severe fevers, nausea or vomiting that prevent you from taking your medications, return to the emergency department immediately. Otherwise contact your local physician or county health department for a follow up appointment to complete STD testing including HIV and syphilis. I have attached the information for the health department.   Gonorrhea and Chlamydia SYMPTOMS  In females, symptoms may go unnoticed. Symptoms that are more noticeable can include:  Belly (abdominal) pain.  Painful intercourse.  Watery mucous-like discharge from the vagina.  Miscarriage.  Discomfort when urinating.   Inflammation of the rectum.  Abnormal gray-green frothy vaginal discharge  Vaginal itching and irritation  Itching and irritation of the area outside the vagina.   Painful urination.  Bleeding after sexual intercourse.  It can cause longstanding (chronic) pelvic pain after frequent infections.  TREATMENT  It is important to finish ALL medications given to you.  If you have Gonorrhea or Chlamydia it can leave to PID, which can cause women to not be able to have children (sterile) if left untreated or if half-treated.   This is a sexually transmitted infection. So you are also at risk for other sexually transmitted diseases, including HIV (AIDS), it is recommended that you get tested as described above. HOME CARE INSTRUCTIONS  Warning: This infection is contagious. Do not have sex until treatment is completed. Follow up at your caregiver's office or the clinic to which you were referred. If your diagnosis (learning what is wrong) is confirmed by culture or some other method, your recent sexual contacts need treatment. Even if they are symptom free or have a negative culture or evaluation, they should be treated.  PREVENTION  Practice safe sex, use condoms, have only one sex partner and be sure your sex partner is not having sex with others.  Ask your caregiver to test you for Gonorrhea and Chlamydia at your regular checkups or sooner if you are having symptoms.  Ask for further information if you are pregnant.  SEEK IMMEDIATE MEDICAL CARE IF:  You develop an oral temperature above 102 F (38.9 C), not controlled by medications or lasting more than 2 days.  You develop an increase  in pain.  You develop any type of abnormal discharge.  You develop vaginal bleeding and it is not time for your period.  You develop painful intercourse.  Additional Information:  Your vital signs today were: BP 112/76 (BP Location: Left Arm)    Pulse (!) 103    Temp 99.3 F (37.4 C) (Oral)    Resp 18    Ht 5\' 9"   (1.753 m)    Wt 85.3 kg (188 lb)    LMP 11/14/2017    SpO2 100%    BMI 27.76 kg/m  If your blood pressure (BP) was elevated above 135/85 this visit, please have this repeated by your doctor within one month. ---------------

## 2017-11-21 LAB — GC/CHLAMYDIA PROBE AMP (~~LOC~~) NOT AT ARMC
CHLAMYDIA, DNA PROBE: NEGATIVE
NEISSERIA GONORRHEA: NEGATIVE

## 2017-11-23 LAB — GC/CHLAMYDIA PROBE AMP (~~LOC~~) NOT AT ARMC
Chlamydia: NEGATIVE
Neisseria Gonorrhea: NEGATIVE

## 2017-12-08 ENCOUNTER — Ambulatory Visit
Admission: RE | Admit: 2017-12-08 | Discharge: 2017-12-08 | Disposition: A | Discharge status: Home / Self Care | Payer: Medicaid Other | Source: Ambulatory Visit | Attending: Otolaryngology | Admitting: Otolaryngology

## 2017-12-08 DIAGNOSIS — H7292 Unspecified perforation of tympanic membrane, left ear: Secondary | ICD-10-CM

## 2017-12-10 ENCOUNTER — Other Ambulatory Visit: Payer: Self-pay | Admitting: Otolaryngology

## 2017-12-10 DIAGNOSIS — H7292 Unspecified perforation of tympanic membrane, left ear: Secondary | ICD-10-CM

## 2018-01-21 ENCOUNTER — Encounter (HOSPITAL_BASED_OUTPATIENT_CLINIC_OR_DEPARTMENT_OTHER): Payer: Self-pay | Admitting: Emergency Medicine

## 2018-01-21 ENCOUNTER — Emergency Department (HOSPITAL_BASED_OUTPATIENT_CLINIC_OR_DEPARTMENT_OTHER)
Admission: EM | Admit: 2018-01-21 | Discharge: 2018-01-21 | Disposition: A | Discharge status: Home / Self Care | Payer: Medicaid Other | Attending: Emergency Medicine | Admitting: Emergency Medicine

## 2018-01-21 ENCOUNTER — Other Ambulatory Visit: Payer: Self-pay

## 2018-01-21 DIAGNOSIS — L42 Pityriasis rosea: Secondary | ICD-10-CM | POA: Insufficient documentation

## 2018-01-21 DIAGNOSIS — F17210 Nicotine dependence, cigarettes, uncomplicated: Secondary | ICD-10-CM | POA: Insufficient documentation

## 2018-01-21 DIAGNOSIS — Z79899 Other long term (current) drug therapy: Secondary | ICD-10-CM | POA: Insufficient documentation

## 2018-01-21 DIAGNOSIS — R21 Rash and other nonspecific skin eruption: Secondary | ICD-10-CM

## 2018-01-21 MED ORDER — HYDROXYZINE HCL 25 MG PO TABS: 25.0000 mg | ORAL_TABLET | Freq: Four times a day (QID) | ORAL | 0 refills | Status: DC

## 2018-01-21 MED ORDER — HYDROXYZINE HCL 25 MG PO TABS: 25.0000 mg | ORAL_TABLET | Freq: Once | ORAL | Status: DC

## 2018-01-21 NOTE — ED Provider Notes (Signed)
[facility] EMERGENCY DEPARTMENT Provider Note   CSN: [id number] Arrival date & time: 01/21/18  1111     History   Chief Complaint Chief Complaint  Patient presents with  . Rash    HPI  Cindy Livingston is a 32 y.o. Female with a history of IBS and anxiety, who presents to the ED for a generalized rash that started 2 days ago.  Patient reports she noticed some itching behind her neck and on her back and then she started to notice some small red bumps on her arms and legs.  The rash is extremely itchy, it does not hurt or burn.  Patient denies any blisterlike lesions or pustules, no fevers or chills, no general malaise, headaches, nausea or vomiting.  Involvement of her eyes no pain, drainage or itching, no bumps or lesions noted in the mouth, or on the genitals.  Does report she was started on Lamictal for mood stabilization about 3 weeks ago and recently had a dose increase, she saw on the bottle that rash is a possible side effect of this medication so wanted to come in to have this evaluated.  She reports she has follow-up with her psychiatrist on Tuesday and a regular primary care doctor she can follow-up with.      Past Medical History:  Diagnosis Date  . Anxiety   . Appendicitis    appendectomy 03/20/12  . IBS (irritable bowel syndrome)   . No pertinent past medical history     There are no active problems to display for this patient.   Past Surgical History:  Procedure Laterality Date  . APPENDECTOMY  03/20/2012  . CESAREAN SECTION  2004, 2007   x 2   . LAPAROSCOPIC APPENDECTOMY  03/20/2012   Procedure: APPENDECTOMY LAPAROSCOPIC;  Surgeon: [name], DO;  Location: [facility] OR;  Service: General;  Laterality: N/A;  Lap. App.  . TYMPANOPLASTY Left 1992 and 1995    OB History    Gravida Para Term Preterm AB Living   2 2 1 1   3    SAB TAB Ectopic Multiple Live Births         1 3       Home Medications    Prior to Admission medications   Medication Sig  Start Date End Date Taking? Authorizing Provider  buPROPion (WELLBUTRIN) 100 MG tablet Take 100 mg by mouth 2 (two) times daily.   Yes [provider]  lamoTRIgine (LAMICTAL) 25 MG tablet Take 25 mg by mouth daily.   Yes [provider]  fluticasone (FLONASE) 50 MCG/ACT nasal spray Place 2 sprays daily into both nostrils. 10/03/17   Cathie Hoops, Amy V, PA-C  neomycin-polymyxin-hydrocortisone (CORTISPORIN) 3.5-10000-1 OTIC suspension Place 4 drops into the left ear 3 (three) times daily. 10/16/17   Elvina Sidle, MD    Family History History reviewed. No pertinent family history.  Social History Social History   Tobacco Use  . Smoking status: Current Every Day Smoker    Packs/day: 1.00    Types: Cigarettes  . Smokeless tobacco: Never Used  Substance Use Topics  . Alcohol use: Yes    Comment: occ  . Drug use: Yes    Types: Marijuana    Comment: once a day     Allergies   Codeine   Review of Systems Review of Systems  Constitutional: Negative for chills, fatigue and fever.  HENT: Negative for mouth sores.   Eyes: Negative for pain, redness and itching.  Respiratory: Negative for  shortness of breath.   Cardiovascular: Negative for chest pain.  Gastrointestinal: Negative for abdominal pain, nausea and vomiting.  Genitourinary: Negative for dysuria.  Musculoskeletal: Negative for arthralgias and myalgias.  Skin: Positive for rash.  Neurological: Negative for weakness.     Physical Exam Updated Vital Signs BP (!) 104/49 (BP Location: Left Arm)   Pulse (!) 103   Temp 98.4 F (36.9 C) (Oral)   Resp 18   Ht 5\' 9"  (1.753 m)   Wt 83 kg (183 lb)   LMP 01/02/2018   SpO2 100%   BMI 27.02 kg/m   Physical Exam  Constitutional: She appears well-developed and well-nourished. No distress.  HENT:  Head: Normocephalic and atraumatic.  Mouth/Throat: Oropharynx is clear and moist.  No lesions noted in the mouth  Eyes: Conjunctivae and EOM are normal. Pupils are  equal, round, and reactive to light. Right eye exhibits no discharge. Left eye exhibits no discharge.  Pulmonary/Chest: Effort normal. No respiratory distress.  Neurological: She is alert. Coordination normal.  Skin: Skin is warm and dry. Capillary refill takes less than 2 seconds. She is not diaphoretic.  Small red raised lesions in Christmas tree distribution over the lower back with larger herald patch over the right hip, several scattered red small raised lesions over the arms and legs, no vesicles, pustules, purpura or petechiae, no surrounding redness or induration, no drainage from any of the lesion  Psychiatric: She has a normal mood and affect. Her behavior is normal.  Nursing note and vitals reviewed.    ED Treatments / Results  Labs (all labs ordered are listed, but only abnormal results are displayed) Labs Reviewed - No data to display  EKG  EKG Interpretation None       Radiology No results found.  Procedures Procedures (including critical care time)  Medications Ordered in ED Medications  hydrOXYzine (ATARAX/VISTARIL) tablet 25 mg (25 mg Oral Not Given 01/21/18 1245)     Initial Impression / Assessment and Plan / ED Course  I have reviewed the triage vital signs and the nursing notes.  Pertinent labs & imaging results that were available during my care of the patient were reviewed by me and considered in my medical decision making (see chart for details).  Presents with rash that started 2 days ago, several raised red areas, rashes extremely pruritic, no vesicles, pustules, petechiae or purpura.  No fevers or systemic symptoms.  Patient mildly tachycardic but reports she always has a slightly elevated heart rate, on my exam pulse palpated and normal.  Rash is in the classic distribution of pityriasis, and is extremely pruritic.  No vesicular lesions, desquamation or mucosal involvement to suggest SJS or TENS, patient is on Keppra but this is not for seizure  prevention is for mood stabilization patient is on a low dose at 50 mg, okay with patient discontinuing this medication as she has upcoming psychiatry appointment in 2 days.  Patient to follow-up with her primary care doctor as well.  Will treat with hydroxyzine or Benadryl, encourage patient to use cool showers and anti-itch creams as needed.  Strict return precautions discussed with the patient regarding worsening of the rash.  She expresses understanding and is in agreement with plan.  Final Clinical Impressions(s) / ED Diagnoses   Final diagnoses:  Rash  Pityriasis rosea    ED Discharge Orders        Ordered    hydrOXYzine (ATARAX/VISTARIL) 25 MG tablet  Every 6 hours     01/21/18  [address]1227       Dartha LodgeFord, Roque Schill N, [place]PA-C 01/21/18 1327    Nira Connardama, Pedro Eduardo, MD 01/22/18 [phone]

## 2018-01-21 NOTE — Discharge Instructions (Signed)
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  You can also use cool showers or over-the-counter anti-itch creams.  Please follow-up with your psychiatrist regarding your lamotrigine, since you are not on this for seizure prevention I recommend discontinuing it to you follow-up with him.  Please also follow-up with your primary care provider to ensure rash is improving.  If you develop any fevers or chills worsening of the rash or blisterlike lesions, pain in your eyes, or mouth or other new or concerning symptoms please return to the ED for reevaluation.

## 2018-01-21 NOTE — ED Triage Notes (Signed)
Patient states that she started to have a Rash to her whole body starting 2 days ago

## 2018-01-30 ENCOUNTER — Encounter (HOSPITAL_BASED_OUTPATIENT_CLINIC_OR_DEPARTMENT_OTHER): Payer: Self-pay | Admitting: *Deleted

## 2018-01-30 ENCOUNTER — Other Ambulatory Visit: Payer: Self-pay

## 2018-01-30 ENCOUNTER — Emergency Department (HOSPITAL_BASED_OUTPATIENT_CLINIC_OR_DEPARTMENT_OTHER)
Admission: EM | Admit: 2018-01-30 | Discharge: 2018-01-30 | Disposition: A | Discharge status: Home / Self Care | Payer: Medicaid Other | Attending: Emergency Medicine | Admitting: Emergency Medicine

## 2018-01-30 DIAGNOSIS — R21 Rash and other nonspecific skin eruption: Secondary | ICD-10-CM | POA: Insufficient documentation

## 2018-01-30 DIAGNOSIS — Z5321 Procedure and treatment not carried out due to patient leaving prior to being seen by health care provider: Secondary | ICD-10-CM | POA: Diagnosis not present

## 2018-01-30 NOTE — ED Notes (Signed)
Pt called twice to be roomed. No answer from lobby.

## 2018-01-30 NOTE — ED Triage Notes (Signed)
Pt reports 1-2 weeks of recurrent rash and itching

## 2018-06-17 ENCOUNTER — Inpatient Hospital Stay (HOSPITAL_COMMUNITY)
Admission: AD | Admit: 2018-06-17 | Discharge: 2018-06-17 | Disposition: A | Discharge status: Home / Self Care | Payer: Medicaid Other | Source: Ambulatory Visit | Attending: Obstetrics and Gynecology | Admitting: Obstetrics and Gynecology

## 2018-06-17 ENCOUNTER — Encounter (HOSPITAL_COMMUNITY): Payer: Self-pay

## 2018-06-17 ENCOUNTER — Other Ambulatory Visit: Payer: Self-pay

## 2018-06-17 DIAGNOSIS — F17210 Nicotine dependence, cigarettes, uncomplicated: Secondary | ICD-10-CM | POA: Diagnosis not present

## 2018-06-17 DIAGNOSIS — N764 Abscess of vulva: Secondary | ICD-10-CM | POA: Diagnosis not present

## 2018-06-17 LAB — URINALYSIS, ROUTINE W REFLEX MICROSCOPIC
BILIRUBIN URINE: NEGATIVE
GLUCOSE, UA: NEGATIVE mg/dL
Hgb urine dipstick: NEGATIVE
Ketones, ur: NEGATIVE mg/dL
Leukocytes, UA: NEGATIVE
NITRITE: NEGATIVE
PH: 6 (ref 5.0–8.0)
Protein, ur: NEGATIVE mg/dL
SPECIFIC GRAVITY, URINE: 1.011 (ref 1.005–1.030)

## 2018-06-17 LAB — WET PREP, GENITAL
Clue Cells Wet Prep HPF POC: NONE SEEN
SPERM: NONE SEEN
Trich, Wet Prep: NONE SEEN
YEAST WET PREP: NONE SEEN

## 2018-06-17 MED ORDER — CEPHALEXIN 500 MG PO CAPS: 500.0000 mg | ORAL_CAPSULE | Freq: Four times a day (QID) | ORAL | 0 refills | Status: DC

## 2018-06-17 NOTE — Progress Notes (Addendum)
Nonpregnant presents to triage for vaginal cyst in the upper area of pt's perineum. States most often able to pop the cyst herself but this time was not able and had been self treating with hot compress to the area. States pain is 8/10.   Provider at bs assessing. GC, wetprep, and HSV done. Cyst noted on upper region of the vaginal area. Provider drained copious amount of brownish cream colored discharge. Pt tolerated procedure well. Pain is now 2/10.   D/c orders received. Pending wetprep results prior to sending pt home  2056: Provider made aware of the wet prep results. Ordered to d/c pt home.   2057: Sitz bath provided. Pt made aware to pickup the abx at pharmacy.   2100: D/c instructions given with pt understanding. Pt left unit via ambulatory

## 2018-06-17 NOTE — Discharge Instructions (Signed)

## 2018-06-17 NOTE — MAU Note (Signed)
Urine in lab 

## 2018-06-17 NOTE — MAU Provider Note (Signed)
History   32-year-old female in today with complaints of painful abscess on vulva.  States she has these frequently and they usually go away on their own but this 1 is gotten more swollen and painful since it has started this is been going on for approximately a week.  She denies any fever.  CSN: [id number]  Arrival date & time 06/17/18  1941   None     No chief complaint on file.   HPI  Past Medical History:  Diagnosis Date  . Anxiety   . Appendicitis    appendectomy 03/20/12  . IBS (irritable bowel syndrome)   . No pertinent past medical history     Past Surgical History:  Procedure Laterality Date  . APPENDECTOMY  03/20/2012  . CESAREAN SECTION  2004, 2007   x 2   . LAPAROSCOPIC APPENDECTOMY  03/20/2012   Procedure: APPENDECTOMY LAPAROSCOPIC;  Surgeon: Lodema PilotBrian Layton, DO;  Location: [facility] OR;  Service: General;  Laterality: N/A;  Lap. App.  . TYMPANOPLASTY Left 1992 and 1995    No family history on file.  Social History   Tobacco Use  . Smoking status: Current Every Day Smoker    Packs/day: 1.00    Types: Cigarettes  . Smokeless tobacco: Never Used  Substance Use Topics  . Alcohol use: Yes    Comment: occ  . Drug use: Yes    Types: Marijuana    Comment: once a day    OB History    Gravida  2   Para  2   Term  1   Preterm  1   AB      Living  3     SAB      TAB      Ectopic      Multiple  1   Live Births  3           Review of Systems  Constitutional: Negative.   HENT: Negative.   Eyes: Negative.   Respiratory: Negative.   Cardiovascular: Negative.   Gastrointestinal: Negative.   Endocrine: Negative.   Genitourinary: Positive for vaginal pain.  Musculoskeletal: Negative.   Skin: Negative.   Allergic/Immunologic: Negative.   Neurological: Negative.   Hematological: Negative.   Psychiatric/Behavioral: Negative.     Allergies  Codeine  Home Medications   Current Outpatient Rx  . Order #: [id number] Class: Normal    There  were no vitals taken for this visit.  Physical Exam  Constitutional: She is oriented to person, place, and time. She appears well-developed and well-nourished.  HENT:  Head: Normocephalic.  Neck: Normal range of motion.  Cardiovascular: Normal rate, regular rhythm, normal heart sounds and intact distal pulses.  Pulmonary/Chest: Effort normal and breath sounds normal.  Abdominal: Soft. Bowel sounds are normal.  Genitourinary:  Genitourinary Comments: Small abscess noted at top of left labia majora near clitoris  Musculoskeletal: Normal range of motion.  Neurological: She is alert and oriented to person, place, and time.  Skin: Skin is warm and dry.  Psychiatric: She has a normal mood and affect. Her behavior is normal. Judgment and thought content normal.    MAU Course  Procedures (including critical care time)  Labs Reviewed  WET PREP, GENITAL  HSV CULTURE AND TYPING  URINALYSIS, ROUTINE W REFLEX MICROSCOPIC  GC/CHLAMYDIA PROBE AMP [facility]   No results found.   1. Carbuncle of vulva       MDM  Vital signs are stable.  Wet prep and  cultures obtained.  Area of abscess expressed with approximately 2 to 3 cc of muco-purulent drainage.  Will discharge home on Keflex 4 times daily for 10 days .  Sitz bath 3 times daily.  Patient instructed to stop shaving area. Will d/c home in stable condition

## 2018-06-19 LAB — GC/CHLAMYDIA PROBE AMP (~~LOC~~) NOT AT ARMC
Chlamydia: NEGATIVE
NEISSERIA GONORRHEA: NEGATIVE

## 2018-06-19 LAB — HSV CULTURE AND TYPING

## 2018-08-04 ENCOUNTER — Ambulatory Visit (HOSPITAL_COMMUNITY)
Admission: EM | Admit: 2018-08-04 | Discharge: 2018-08-04 | Disposition: A | Discharge status: Home / Self Care | Payer: Medicaid Other | Attending: Family Medicine | Admitting: Family Medicine

## 2018-08-04 ENCOUNTER — Encounter (HOSPITAL_COMMUNITY): Payer: Self-pay

## 2018-08-04 DIAGNOSIS — Z4800 Encounter for change or removal of nonsurgical wound dressing: Secondary | ICD-10-CM | POA: Insufficient documentation

## 2018-08-04 DIAGNOSIS — Z885 Allergy status to narcotic agent status: Secondary | ICD-10-CM | POA: Diagnosis not present

## 2018-08-04 DIAGNOSIS — T8149XA Infection following a procedure, other surgical site, initial encounter: Secondary | ICD-10-CM

## 2018-08-04 DIAGNOSIS — F17210 Nicotine dependence, cigarettes, uncomplicated: Secondary | ICD-10-CM | POA: Diagnosis not present

## 2018-08-04 DIAGNOSIS — Z79899 Other long term (current) drug therapy: Secondary | ICD-10-CM | POA: Insufficient documentation

## 2018-08-04 DIAGNOSIS — T8141XA Infection following a procedure, superficial incisional surgical site, initial encounter: Secondary | ICD-10-CM | POA: Insufficient documentation

## 2018-08-04 DIAGNOSIS — F419 Anxiety disorder, unspecified: Secondary | ICD-10-CM | POA: Insufficient documentation

## 2018-08-04 DIAGNOSIS — T8130XA Disruption of wound, unspecified, initial encounter: Secondary | ICD-10-CM | POA: Diagnosis not present

## 2018-08-04 DIAGNOSIS — Y838 Other surgical procedures as the cause of abnormal reaction of the patient, or of later complication, without mention of misadventure at the time of the procedure: Secondary | ICD-10-CM | POA: Diagnosis not present

## 2018-08-04 DIAGNOSIS — Z5189 Encounter for other specified aftercare: Secondary | ICD-10-CM

## 2018-08-04 DIAGNOSIS — K589 Irritable bowel syndrome without diarrhea: Secondary | ICD-10-CM | POA: Diagnosis not present

## 2018-08-04 DIAGNOSIS — L0889 Other specified local infections of the skin and subcutaneous tissue: Secondary | ICD-10-CM

## 2018-08-04 MED ORDER — CEPHALEXIN 500 MG PO CAPS: 500.0000 mg | ORAL_CAPSULE | Freq: Four times a day (QID) | ORAL | 0 refills | Status: AC

## 2018-08-04 NOTE — Discharge Instructions (Signed)
Cleanse daily with soap and water. Keep covered to keep clean. Avoid picking or touching at this.  Complete course of antibiotics.   Please follow up with your surgeon's office on Monday for recheck next week.  If develop worsening of pain, fevers, drainage or otherwise worsening over the weekend to return or go to the Er.

## 2018-08-04 NOTE — ED Provider Notes (Signed)
[facility]    CSN: [id number] Arrival date & time: 08/04/18  1619     History   Chief Complaint Chief Complaint  Patient presents with  . Post-op Problem    HPI Cindy Livingston is a 32 y.o. female.   Cindy Livingston presents with concerns of drainage from her umbilical laparoscopic site. She had tubal ligation 9/9 by Dr. Marvene Staff. States noticed a small amount of drainage two days ago but this morning woke with much more yellow/pink drainage on her shirt. Denies any increased pain to the area, redness or swelling. Feels more bloated. States the area itches. No fevers. Does not have a follow up until October. States tried to call the office but they were already closed. No known MRSA history. Hx of anxiety, appendicitis, IBS.     ROS per HPI.      Past Medical History:  Diagnosis Date  . Anxiety   . Appendicitis    appendectomy 03/20/12  . IBS (irritable bowel syndrome)   . No pertinent past medical history     There are no active problems to display for this patient.   Past Surgical History:  Procedure Laterality Date  . APPENDECTOMY  03/20/2012  . CESAREAN SECTION  2004, 2007   x 2   . LAPAROSCOPIC APPENDECTOMY  03/20/2012   Procedure: APPENDECTOMY LAPAROSCOPIC;  Surgeon: [name], DO;  Location: [facility] OR;  Service: General;  Laterality: N/A;  Lap. App.  . TYMPANOPLASTY Left 1992 and 1995    OB History    Gravida  2   Para  2   Term  1   Preterm  1   AB      Living  3     SAB      TAB      Ectopic      Multiple  1   Live Births  3            Home Medications    Prior to Admission medications   Medication Sig Start Date End Date Taking? Authorizing Provider  cephALEXin (KEFLEX) 500 MG capsule Take 1 capsule (500 mg total) by mouth 4 (four) times daily for 10 days. 08/04/18 08/14/18  Georgetta Haber, NP  fluticasone (FLONASE) 50 MCG/ACT nasal spray Place 2 sprays daily into both nostrils. 10/03/17   Cathie Hoops, Amy V, PA-C  hydrOXYzine  (ATARAX/VISTARIL) 25 MG tablet Take 1 tablet (25 mg total) by mouth every 6 (six) hours. 01/21/18   Dartha Lodge, PA-C    Family History Family History  Problem Relation Age of Onset  . Depression Mother   . Alcohol abuse Mother   . Drug abuse Mother   . Depression Maternal Grandmother   . Hyperlipidemia Maternal Grandmother   . Cancer Maternal Grandmother     Social History Social History   Tobacco Use  . Smoking status: Current Every Day Smoker    Packs/day: 1.00    Types: Cigarettes  . Smokeless tobacco: Never Used  Substance Use Topics  . Alcohol use: Yes    Comment: occ  . Drug use: Yes    Types: Marijuana    Comment: no longer do marijuana - last use was 6months ago     Allergies   Codeine   Review of Systems Review of Systems   Physical Exam Triage Vital Signs ED Triage Vitals  Enc Vitals Group     BP 08/04/18 1642 105/65     Pulse Rate 08/04/18 1642 91  Resp 08/04/18 1642 18     Temp 08/04/18 1642 98 F (36.7 C)     Temp Source 08/04/18 1642 Oral     SpO2 08/04/18 1642 100 %     Weight --      Height --      Head Circumference --      Peak Flow --      Pain Score 08/04/18 1646 5     Pain Loc --      Pain Edu? --      Excl. in GC? --    No data found.  Updated Vital Signs BP 105/65 (BP Location: Left Arm)   Pulse 91   Temp 98 F (36.7 C) (Oral)   Resp 18   LMP 07/28/2018   SpO2 100%   Visual Acuity Right Eye Distance:   Left Eye Distance:   Bilateral Distance:    Right Eye Near:   Left Eye Near:    Bilateral Near:     Physical Exam  Constitutional: She is oriented to person, place, and time. She appears well-developed and well-nourished. No distress.  Cardiovascular: Normal rate, regular rhythm and normal heart sounds.  Pulmonary/Chest: Effort normal and breath sounds normal.  Abdominal: Soft. There is no tenderness.  Healing bruising noted to abdomen as well as just inferior to umbilicus; umbilicus with incision with wound  glue covering; non tender; no surrounding redness; serosang drainage noted to gauze patient arrives with with mild serous drainage noted to wound; see photos   Neurological: She is alert and oriented to person, place, and time.  Skin: Skin is warm and dry.          UC Treatments / Results  Labs (all labs ordered are listed, but only abnormal results are displayed) Labs Reviewed  AEROBIC CULTURE (SUPERFICIAL SPECIMEN)    EKG None  Radiology No results found.  Procedures Procedures (including critical care time)  Medications Ordered in UC Medications - No data to display  Initial Impression / Assessment and Plan / UC Course  I have reviewed the triage vital signs and the nursing notes.  Pertinent labs & imaging results that were available during my care of the patient were reviewed by me and considered in my medical decision making (see chart for details).     Increased drainage 5 days post operatively. No fevers, increased pain or redness to the incision, no obvious dehiscence. Will start course of keflex at this time with strict return precautions discussed. Wound culture obtained although not a significant amount of drainage present. To follow up on Monday with surgeon for recheck. Patient verbalized understanding and agreeable to plan.    Final Clinical Impressions(s) / UC Diagnoses   Final diagnoses:  Visit for wound check  Postoperative wound infection     Discharge Instructions     Cleanse daily with soap and water. Keep covered to keep clean. Avoid picking or touching at this.  Complete course of antibiotics.   Please follow up with your surgeon's office on Monday for recheck next week.  If develop worsening of pain, fevers, drainage or otherwise worsening over the weekend to return or go to the Er.    ED Prescriptions    Medication Sig Dispense Auth. Provider   cephALEXin (KEFLEX) 500 MG capsule Take 1 capsule (500 mg total) by mouth 4 (four) times  daily for 10 days. 40 capsule Georgetta HaberBurky, Jovonne Wilton B, NP     Controlled Substance Prescriptions [place] Controlled Substance Registry consulted? Not Applicable  Georgetta Haber, NP 08/04/18 [phone]

## 2018-08-04 NOTE — ED Triage Notes (Signed)
Pt presents with drainage at site from surgery on Monday. Denies any fevers.

## 2018-08-06 LAB — AEROBIC CULTURE  (SUPERFICIAL SPECIMEN)

## 2018-08-06 LAB — AEROBIC CULTURE W GRAM STAIN (SUPERFICIAL SPECIMEN)

## 2018-08-07 ENCOUNTER — Telehealth (HOSPITAL_COMMUNITY): Payer: Self-pay

## 2018-08-07 NOTE — Telephone Encounter (Signed)
Culture is positive for Streptococcus. Per Dorene GrebeNatalie FNP patient needs to follow up with surgeon and continue keflex until she sees them. Attempted to reach patient. No answer at this time.

## 2018-08-08 ENCOUNTER — Telehealth (HOSPITAL_COMMUNITY): Payer: Self-pay

## 2018-08-08 NOTE — Telephone Encounter (Signed)
No answer x2 

## 2018-08-10 ENCOUNTER — Telehealth (HOSPITAL_COMMUNITY): Payer: Self-pay

## 2018-08-10 NOTE — Telephone Encounter (Signed)
No answer x 3. Letter sent to patient. 

## 2019-02-11 ENCOUNTER — Other Ambulatory Visit: Payer: Self-pay

## 2019-02-11 ENCOUNTER — Emergency Department (HOSPITAL_COMMUNITY)
Admission: EM | Admit: 2019-02-11 | Discharge: 2019-02-11 | Disposition: A | Discharge status: Home / Self Care | Payer: Medicaid Other | Attending: Emergency Medicine | Admitting: Emergency Medicine

## 2019-02-11 ENCOUNTER — Encounter (HOSPITAL_COMMUNITY): Payer: Self-pay

## 2019-02-11 DIAGNOSIS — Z9889 Other specified postprocedural states: Secondary | ICD-10-CM | POA: Insufficient documentation

## 2019-02-11 DIAGNOSIS — F17210 Nicotine dependence, cigarettes, uncomplicated: Secondary | ICD-10-CM | POA: Insufficient documentation

## 2019-02-11 DIAGNOSIS — K625 Hemorrhage of anus and rectum: Secondary | ICD-10-CM | POA: Diagnosis present

## 2019-02-11 LAB — BASIC METABOLIC PANEL
Anion gap: 6 (ref 5–15)
BUN: 8 mg/dL (ref 6–20)
CALCIUM: 8.6 mg/dL — AB (ref 8.9–10.3)
CO2: 22 mmol/L (ref 22–32)
CREATININE: 0.78 mg/dL (ref 0.44–1.00)
Chloride: 110 mmol/L (ref 98–111)
GFR calc Af Amer: >60 mL/min (ref >60)
GFR calc non Af Amer: >60 mL/min (ref >60)
GLUCOSE: 102 mg/dL — AB (ref 70–99)
Potassium: 3.7 mmol/L (ref 3.5–5.1)
Sodium: 138 mmol/L (ref 135–145)

## 2019-02-11 LAB — CBC WITH DIFFERENTIAL/PLATELET
Abs Immature Granulocytes: 0.04 10*3/uL (ref 0.00–0.07)
BASOS ABS: 0.1 10*3/uL (ref 0.0–0.1)
Basophils Relative: 1 %
Eosinophils Absolute: 0.2 10*3/uL (ref 0.0–0.5)
Eosinophils Relative: 2 %
HEMATOCRIT: 40.2 % (ref 36.0–46.0)
Hemoglobin: 12.3 g/dL (ref 12.0–15.0)
IMMATURE GRANULOCYTES: 0 %
LYMPHS ABS: 3.5 10*3/uL (ref 0.7–4.0)
LYMPHS PCT: 33 %
MCH: 25 pg — ABNORMAL LOW (ref 26.0–34.0)
MCHC: 30.6 g/dL (ref 30.0–36.0)
MCV: 81.7 fL (ref 80.0–100.0)
MONO ABS: 0.9 10*3/uL (ref 0.1–1.0)
MONOS PCT: 9 %
NRBC: 0 % (ref 0.0–0.2)
Neutro Abs: 5.9 10*3/uL (ref 1.7–7.7)
Neutrophils Relative %: 55 %
Platelets: 280 10*3/uL (ref 150–400)
RBC: 4.92 MIL/uL (ref 3.87–5.11)
RDW: 13.8 % (ref 11.5–15.5)
WBC: 10.6 10*3/uL — ABNORMAL HIGH (ref 4.0–10.5)

## 2019-02-11 NOTE — ED Provider Notes (Signed)
[facility] EMERGENCY DEPARTMENT Provider Note   CSN: [id number] Arrival date & time: 02/11/19  0228    History   Chief Complaint Chief Complaint  Patient presents with  . Rectal Bleeding    HPI Cindy Livingston is a 33 y.o. female.     Patient is a 32 year old female with history of irritable bowel.  She presents today for evaluation of rectal bleeding.  Approximately 10 days ago she underwent colonoscopy with removal of several polyps.  This was performed in [place].  This evening she went to the bathroom and passed bright red blood that was present in the toilet water and with wiping.  She denies to me that she is experiencing any pain.  She denies any fevers or chills.  The history is provided by the patient.  Rectal Bleeding  Quality:  Bright red Amount:  Moderate Timing:  Constant Chronicity:  New Context: not constipation   Relieved by:  Nothing Worsened by:  Nothing Ineffective treatments:  None tried Associated symptoms: no fever and no hematemesis     Past Medical History:  Diagnosis Date  . Anxiety   . Appendicitis    appendectomy 03/20/12  . IBS (irritable bowel syndrome)   . No pertinent past medical history     There are no active problems to display for this patient.   Past Surgical History:  Procedure Laterality Date  . APPENDECTOMY  03/20/2012  . CESAREAN SECTION  2004, 2007   x 2   . LAPAROSCOPIC APPENDECTOMY  03/20/2012   Procedure: APPENDECTOMY LAPAROSCOPIC;  Surgeon: [name], DO;  Location: [facility] OR;  Service: General;  Laterality: N/A;  Lap. App.  . TYMPANOPLASTY Left 1992 and 1995     OB History    Gravida  2   Para  2   Term  1   Preterm  1   AB      Living  3     SAB      TAB      Ectopic      Multiple  1   Live Births  3            Home Medications    Prior to Admission medications   Medication Sig Start Date End Date Taking? Authorizing Provider  fluticasone (FLONASE) 50 MCG/ACT  nasal spray Place 2 sprays daily into both nostrils. 10/03/17   Cathie Hoops, Amy V, PA-C  hydrOXYzine (ATARAX/VISTARIL) 25 MG tablet Take 1 tablet (25 mg total) by mouth every 6 (six) hours. 01/21/18   Dartha Lodge, PA-C    Family History Family History  Problem Relation Age of Onset  . Depression Mother   . Alcohol abuse Mother   . Drug abuse Mother   . Depression Maternal Grandmother   . Hyperlipidemia Maternal Grandmother   . Cancer Maternal Grandmother     Social History Social History   Tobacco Use  . Smoking status: Current Every Day Smoker    Packs/day: 1.00    Types: Cigarettes  . Smokeless tobacco: Never Used  Substance Use Topics  . Alcohol use: Yes    Comment: occ  . Drug use: Yes    Types: Marijuana    Comment: no longer do marijuana - last use was 67months ago     Allergies   Codeine   Review of Systems Review of Systems  Constitutional: Negative for fever.  Gastrointestinal: Positive for hematochezia. Negative for hematemesis.  All other systems reviewed and are negative.  Physical Exam Updated Vital Signs There were no vitals taken for this visit.  Physical Exam Vitals signs and nursing note reviewed.  Constitutional:      General: She is not in acute distress.    Appearance: She is well-developed. She is not diaphoretic.  HENT:     Head: Normocephalic and atraumatic.  Neck:     Musculoskeletal: Normal range of motion and neck supple.  Cardiovascular:     Rate and Rhythm: Normal rate and regular rhythm.     Heart sounds: No murmur. No friction rub. No gallop.   Pulmonary:     Effort: Pulmonary effort is normal. No respiratory distress.     Breath sounds: Normal breath sounds. No wheezing.  Abdominal:     General: Bowel sounds are normal. There is no distension.     Palpations: Abdomen is soft.     Tenderness: There is no abdominal tenderness.  Musculoskeletal: Normal range of motion.  Skin:    General: Skin is warm and dry.  Neurological:      Mental Status: She is alert and oriented to person, place, and time.      ED Treatments / Results  Labs (all labs ordered are listed, but only abnormal results are displayed) Labs Reviewed - No data to display  EKG None  Radiology No results found.  Procedures Procedures (including critical care time)  Medications Ordered in ED Medications - No data to display   Initial Impression / Assessment and Plan / ED Course  I have reviewed the triage vital signs and the nursing notes.  Pertinent labs & imaging results that were available during my care of the patient were reviewed by me and considered in my medical decision making (see chart for details).  Patient is a 33 year old female presenting with complaints of rectal bleeding.  She underwent a colonoscopy approximately 10 days ago during which several polyps were removed.  This was performed somewhere in [place].  She is hemodynamically stable and her hemoglobin today is 12.3.  She has been observed for several hours in the ER and has experienced no additional bleeding.  I suspect the patient is experiencing some bleeding as the eschar from the polypectomy has fallen off.  Again, there is no active bleeding at this time and I believe the patient is appropriate to be discharged.  I will advise her to take stool softener/Metamucil and follow-up with GI on Monday.  She is to return if she worsens.  Final Clinical Impressions(s) / ED Diagnoses   Final diagnoses:  None    ED Discharge Orders    None       Geoffery Lyons, MD 02/11/19 [id number]

## 2019-02-11 NOTE — Discharge Instructions (Addendum)
Metamucil: 1 heaping teaspoon in a glass of water 3 times daily.  Follow-up with your gastroenterologist on Monday, and return to the ER if you develop severe abdominal pain, high fever, worsening bleeding, dizziness, or other new and concerning symptoms.

## 2019-02-11 NOTE — ED Triage Notes (Signed)
Pt had colonoscopy in the last week.  States she had increased rectal bleeding today.  Intermittent pain with bleeding.  A&Ox4, not pale or short of breath.

## 2019-12-01 ENCOUNTER — Other Ambulatory Visit: Payer: Self-pay

## 2019-12-01 ENCOUNTER — Ambulatory Visit (HOSPITAL_COMMUNITY)
Admission: EM | Admit: 2019-12-01 | Discharge: 2019-12-01 | Disposition: A | Discharge status: Home / Self Care | Payer: Medicaid Other | Attending: Emergency Medicine | Admitting: Emergency Medicine

## 2019-12-01 ENCOUNTER — Encounter (HOSPITAL_COMMUNITY): Payer: Self-pay

## 2019-12-01 DIAGNOSIS — F419 Anxiety disorder, unspecified: Secondary | ICD-10-CM | POA: Insufficient documentation

## 2019-12-01 DIAGNOSIS — J069 Acute upper respiratory infection, unspecified: Secondary | ICD-10-CM | POA: Insufficient documentation

## 2019-12-01 DIAGNOSIS — F17210 Nicotine dependence, cigarettes, uncomplicated: Secondary | ICD-10-CM | POA: Insufficient documentation

## 2019-12-01 DIAGNOSIS — U071 COVID-19: Secondary | ICD-10-CM | POA: Insufficient documentation

## 2019-12-01 DIAGNOSIS — K589 Irritable bowel syndrome without diarrhea: Secondary | ICD-10-CM | POA: Insufficient documentation

## 2019-12-01 DIAGNOSIS — Z79899 Other long term (current) drug therapy: Secondary | ICD-10-CM | POA: Insufficient documentation

## 2019-12-01 MED ORDER — AMOXICILLIN-POT CLAVULANATE 875-125 MG PO TABS: 1.0000 | ORAL_TABLET | Freq: Two times a day (BID) | ORAL | 0 refills | Status: AC

## 2019-12-01 MED ORDER — CETIRIZINE HCL 10 MG PO CAPS: 10.0000 mg | ORAL_CAPSULE | Freq: Every day | ORAL | 0 refills | Status: DC

## 2019-12-01 NOTE — Discharge Instructions (Signed)
Covid swab pending, quarantine until results return, if positive will need to quarantine for 10 days from symptoms start as well as until symptoms improving and fever free Continue to rest and drink plenty of fluids Continue Flonase, add in daily cetirizine and you may start Singulair as well Use anti-inflammatories for pain/swelling. You may take up to 800 mg Ibuprofen every 8 hours with food. You may supplement Ibuprofen with Tylenol [phone] every 8 hours.  If Covid swab negative and still not having any improvement with the above in 3 to 4 days you may fill prescription for Augmentin to treat for sinus infection.  Please follow-up if any symptoms not improving or worsening, developing difficulty breathing, increased neck pain/stiffness or fevers

## 2019-12-01 NOTE — ED Triage Notes (Signed)
C/o sinus pressure behind eyes, HA, dizziness when pressure increases, neck stiffness. Denies SOB, fever, chills, n/v/d.

## 2019-12-02 LAB — NOVEL CORONAVIRUS, NAA (HOSP ORDER, SEND-OUT TO REF LAB; TAT 18-24 HRS): SARS-CoV-2, NAA: DETECTED — AB

## 2019-12-02 NOTE — ED Provider Notes (Signed)
[facility]    CSN: [id number] Arrival date & time: 12/01/19  1455      History   Chief Complaint Chief Complaint  Patient presents with  . Facial Pain    HPI Cindy Livingston is a 34 y.o. female history of IBS, tympanoplasty presenting today for evaluation of sinus pressure and congestion.  Patient states that over the past 4 to 5 days she has had a lot of pressure as well as headaches.  She has had associated dizziness with this and some mild neck discomfort.  Symptoms are worse at nighttime.  She does note that she is also had a cough, but has attributed this to tobacco use.  She denies any fevers chills or body aches.  Denies chest pain or shortness of breath.  Feels similar to previous sinus infections.  She has been using Flonase and ibuprofen.  She also notes that she had leftover antibiotics which sounds like it was Keflex and she started taking this and has not helped.  Denies any known Covid exposure.  HPI  Past Medical History:  Diagnosis Date  . Anxiety   . Appendicitis    appendectomy 03/20/12  . IBS (irritable bowel syndrome)   . No pertinent past medical history     There are no problems to display for this patient.   Past Surgical History:  Procedure Laterality Date  . APPENDECTOMY  03/20/2012  . CESAREAN SECTION  2004, 2007   x 2   . FOOT SURGERY Right   . LAPAROSCOPIC APPENDECTOMY  03/20/2012   Procedure: APPENDECTOMY LAPAROSCOPIC;  Surgeon: [name], DO;  Location: [facility] OR;  Service: General;  Laterality: N/A;  Lap. App.  . TUBAL LIGATION    . TYMPANOPLASTY Left 1992 and 1995    OB History    Gravida  2   Para  2   Term  1   Preterm  1   AB      Living  3     SAB      TAB      Ectopic      Multiple  1   Live Births  3            Home Medications    Prior to Admission medications   Medication Sig Start Date End Date Taking? Authorizing Provider  fluticasone (FLONASE) 50 MCG/ACT nasal spray Place 2 sprays daily  into both nostrils. 10/03/17  Yes Yu, Amy V, PA-C  montelukast (SINGULAIR) 10 MG tablet Take 10 mg by mouth at bedtime.   Yes [provider]  amoxicillin-clavulanate (AUGMENTIN) 875-125 MG tablet Take 1 tablet by mouth every 12 (twelve) hours for 7 days. 12/04/19 12/11/19  Sapir Lavey C, PA-C  Cetirizine HCl 10 MG CAPS Take 1 capsule (10 mg total) by mouth daily. 12/01/19   Margaretta Chittum C, PA-C  linaclotide (LINZESS) 145 MCG CAPS capsule Take 145 mcg by mouth daily. 01/26/19 12/01/19  [provider]    Family History Family History  Problem Relation Age of Onset  . Depression Mother   . Alcohol abuse Mother   . Drug abuse Mother   . Depression Maternal Grandmother   . Hyperlipidemia Maternal Grandmother   . Cancer Maternal Grandmother     Social History Social History   Tobacco Use  . Smoking status: Current Every Day Smoker    Packs/day: 1.00    Types: Cigarettes  . Smokeless tobacco: Never Used  Substance Use Topics  . Alcohol use:  Yes    Comment: occ  . Drug use: Yes    Types: Marijuana    Comment: no longer do marijuana - last use was 70months ago     Allergies   Vicodin [hydrocodone-acetaminophen], Codeine, and Wellbutrin [bupropion]   Review of Systems Review of Systems  Constitutional: Negative for activity change, appetite change, chills, fatigue and fever.  HENT: Positive for congestion, rhinorrhea and sinus pressure. Negative for ear pain, sore throat and trouble swallowing.   Eyes: Negative for discharge and redness.  Respiratory: Positive for cough. Negative for chest tightness and shortness of breath.   Cardiovascular: Negative for chest pain.  Gastrointestinal: Negative for abdominal pain, diarrhea, nausea and vomiting.  Musculoskeletal: Positive for myalgias and neck pain.  Skin: Negative for rash.  Neurological: Positive for dizziness and headaches. Negative for light-headedness.     Physical Exam Triage Vital Signs ED Triage  Vitals  Enc Vitals Group     BP 12/01/19 1600 117/87     Pulse Rate 12/01/19 1600 97     Resp 12/01/19 1600 18     Temp 12/01/19 1600 98.2 F (36.8 C)     Temp Source 12/01/19 1600 Oral     SpO2 12/01/19 1600 100 %     Weight --      Height --      Head Circumference --      Peak Flow --      Pain Score 12/01/19 1556 5     Pain Loc --      Pain Edu? --      Excl. in [place]? --    No data found.  Updated Vital Signs BP 117/87 (BP Location: Left Arm)   Pulse 97   Temp 98.2 F (36.8 C) (Oral)   Resp 18   LMP 11/23/2019   SpO2 100%   Visual Acuity Right Eye Distance:   Left Eye Distance:   Bilateral Distance:    Right Eye Near:   Left Eye Near:    Bilateral Near:     Physical Exam Vitals and nursing note reviewed.  Constitutional:      General: She is not in acute distress.    Appearance: She is well-developed.  HENT:     Head: Normocephalic and atraumatic.     Ears:     Comments: Bilateral ears without tenderness to palpation of external auricle, tragus and mastoid, EAC's without erythema or swelling, TM's with good bony landmarks and cone of light. Non erythematous.    Mouth/Throat:     Comments: Oral mucosa pink and moist, no tonsillar enlargement or exudate. Posterior pharynx patent and nonerythematous, no uvula deviation or swelling. Normal phonation. Eyes:     Conjunctiva/sclera: Conjunctivae normal.  Neck:     Comments: Full active range of motion of neck, no nuchal rigidity, mild tenderness to palpation to cervical musculature Cardiovascular:     Rate and Rhythm: Normal rate and regular rhythm.     Heart sounds: No murmur.  Pulmonary:     Effort: Pulmonary effort is normal. No respiratory distress.     Breath sounds: Normal breath sounds.     Comments: Breathing comfortably at rest, CTABL, no wheezing, rales or other adventitious sounds auscultated Abdominal:     Palpations: Abdomen is soft.     Tenderness: There is no abdominal tenderness.    Musculoskeletal:     Cervical back: Neck supple.  Skin:    General: Skin is warm and dry.  Neurological:  Mental Status: She is alert.      UC Treatments / Results  Labs (all labs ordered are listed, but only abnormal results are displayed) Labs Reviewed  NOVEL CORONAVIRUS, NAA (HOSP ORDER, SEND-OUT TO REF LAB; TAT 18-24 HRS) - Abnormal; Notable for the following components:      Result Value   SARS-CoV-2, NAA DETECTED (*)    All other components within normal limits    EKG   Radiology No results found.  Procedures Procedures (including critical care time)  Medications Ordered in UC Medications - No data to display  Initial Impression / Assessment and Plan / UC Course  I have reviewed the triage vital signs and the nursing notes.  Pertinent labs & imaging results that were available during my care of the patient were reviewed by me and considered in my medical decision making (see chart for details).    URI symptoms x4 to 5 days with sinus pressure and congestion.  Covid PCR pending.  Discussed with patient likely viral etiology at this time recommending symptomatic and supportive care.  Discussed deferring antibiotic therapy of this area has history of recurrent sinusitis until Covid swab returns as well as continued symptomatic and supportive care for few days.  Did provide prescription for Augmentin to that she can fill in 4 days if still not having any improvement with Flonase, adding in cetirizine and anti-inflammatories.  Discussed strict return precautions. Patient verbalized understanding and is agreeable with plan.   Final Clinical Impressions(s) / UC Diagnoses   Final diagnoses:  Viral URI with cough     Discharge Instructions     Covid swab pending, quarantine until results return, if positive will need to quarantine for 10 days from symptoms start as well as until symptoms improving and fever free Continue to rest and drink plenty of  fluids Continue Flonase, add in daily cetirizine and you may start Singulair as well Use anti-inflammatories for pain/swelling. You may take up to 800 mg Ibuprofen every 8 hours with food. You may supplement Ibuprofen with Tylenol [phone] mg every 8 hours.  If Covid swab negative and still not having any improvement with the above in 3 to 4 days you may fill prescription for Augmentin to treat for sinus infection.  Please follow-up if any symptoms not improving or worsening, developing difficulty breathing, increased neck pain/stiffness or fevers   ED Prescriptions    Medication Sig Dispense Auth. Provider   Cetirizine HCl 10 MG CAPS Take 1 capsule (10 mg total) by mouth daily. 15 capsule Sienna Stonehocker C, PA-C   amoxicillin-clavulanate (AUGMENTIN) 875-125 MG tablet Take 1 tablet by mouth every 12 (twelve) hours for 7 days. 14 tablet Aldea Avis, [place] C, PA-C     PDMP not reviewed this encounter.   Sharyon Cable Volta C, [place] 12/02/19 [phone]

## 2019-12-04 ENCOUNTER — Telehealth (HOSPITAL_COMMUNITY): Payer: Self-pay | Admitting: Emergency Medicine

## 2019-12-04 ENCOUNTER — Encounter (HOSPITAL_COMMUNITY): Payer: Self-pay

## 2019-12-04 NOTE — Telephone Encounter (Signed)

## 2020-05-29 DIAGNOSIS — M25552 Pain in left hip: Secondary | ICD-10-CM | POA: Diagnosis not present

## 2020-05-29 DIAGNOSIS — M6281 Muscle weakness (generalized): Secondary | ICD-10-CM | POA: Diagnosis not present

## 2020-05-29 DIAGNOSIS — M25652 Stiffness of left hip, not elsewhere classified: Secondary | ICD-10-CM | POA: Diagnosis not present

## 2020-05-29 DIAGNOSIS — M533 Sacrococcygeal disorders, not elsewhere classified: Secondary | ICD-10-CM | POA: Diagnosis not present

## 2020-05-29 DIAGNOSIS — R29898 Other symptoms and signs involving the musculoskeletal system: Secondary | ICD-10-CM | POA: Diagnosis not present

## 2020-05-29 DIAGNOSIS — M6289 Other specified disorders of muscle: Secondary | ICD-10-CM | POA: Diagnosis not present

## 2020-08-02 ENCOUNTER — Ambulatory Visit
Admission: EM | Admit: 2020-08-02 | Discharge: 2020-08-02 | Disposition: A | Discharge status: Home / Self Care | Payer: Medicaid Other | Attending: Emergency Medicine | Admitting: Emergency Medicine

## 2020-08-02 ENCOUNTER — Encounter: Payer: Self-pay | Admitting: Emergency Medicine

## 2020-08-02 ENCOUNTER — Other Ambulatory Visit: Payer: Self-pay

## 2020-08-02 DIAGNOSIS — J029 Acute pharyngitis, unspecified: Secondary | ICD-10-CM | POA: Diagnosis present

## 2020-08-02 LAB — POCT RAPID STREP A (OFFICE): Rapid Strep A Screen: NEGATIVE

## 2020-08-02 MED ORDER — AMOXICILLIN 500 MG PO CAPS: 500.0000 mg | ORAL_CAPSULE | Freq: Two times a day (BID) | ORAL | 0 refills | Status: AC

## 2020-08-02 NOTE — Discharge Instructions (Signed)

## 2020-08-02 NOTE — ED Triage Notes (Signed)
Pt here for sore throat; pt sts both her daughters have the same with strep diagnosis

## 2020-08-02 NOTE — ED Provider Notes (Signed)
[name]    CSN: [id number] Arrival date & time: 08/02/20  0846      History   Chief Complaint Chief Complaint  Patient presents with  . Sore Throat    HPI Cindy Livingston is a 34 y.o. female  Presenting for evaluation of sore throat.  Patient endorsing tonsillar hypertrophy, exudate, lymphadenopathy.  Has subjective fever.  No cough, nausea, vomiting, abdominal pain, chest pain, difficulty breathing.  States her 2 daughters were both tested for strep and Covid: Covid test were negative, rapid strep is negative, the culture is positive.  Of note, had COVID-19 infection in January 2021.  Past Medical History:  Diagnosis Date  . Anxiety   . Appendicitis    appendectomy 03/20/12  . IBS (irritable bowel syndrome)   . No pertinent past medical history     There are no problems to display for this patient.   Past Surgical History:  Procedure Laterality Date  . APPENDECTOMY  03/20/2012  . CESAREAN SECTION  2004, 2007   x 2   . FOOT SURGERY Right   . LAPAROSCOPIC APPENDECTOMY  03/20/2012   Procedure: APPENDECTOMY LAPAROSCOPIC;  Surgeon: [name], DO;  Location: [facility];  Service: General;  Laterality: N/A;  Lap. App.  . TUBAL LIGATION    . TYMPANOPLASTY Left 1992 and 1995    OB History    Gravida  2   Para  2   Term  1   Preterm  1   AB      Living  3     SAB      TAB      Ectopic      Multiple  1   Live Births  3            Home Medications    Prior to Admission medications   Medication Sig Start Date End Date Taking? Authorizing Provider  amoxicillin (AMOXIL) 500 MG capsule Take 1 capsule (500 mg total) by mouth 2 (two) times daily for 10 days. 08/02/20 08/12/20  Hall-Potvin, [place], PA-C  Cetirizine HCl 10 MG CAPS Take 1 capsule (10 mg total) by mouth daily. 12/01/19   Wieters, Hallie C, PA-C  fluticasone (FLONASE) 50 MCG/ACT nasal spray Place 2 sprays daily into both nostrils. 10/03/17   Cathie Hoops, Amy V, PA-C  montelukast  (SINGULAIR) 10 MG tablet Take 10 mg by mouth at bedtime.    [provider]  linaclotide (LINZESS) 145 MCG CAPS capsule Take 145 mcg by mouth daily. 01/26/19 12/01/19  [provider]    Family History Family History  Problem Relation Age of Onset  . Depression Mother   . Alcohol abuse Mother   . Drug abuse Mother   . Depression Maternal Grandmother   . Hyperlipidemia Maternal Grandmother   . Cancer Maternal Grandmother     Social History Social History   Tobacco Use  . Smoking status: Current Every Day Smoker    Packs/day: 1.00    Types: Cigarettes  . Smokeless tobacco: Never Used  Vaping Use  . Vaping Use: Never used  Substance Use Topics  . Alcohol use: Yes    Comment: occ  . Drug use: Yes    Types: Marijuana    Comment: no longer do marijuana - last use was 68months ago     Allergies   Vicodin [hydrocodone-acetaminophen], Codeine, and Wellbutrin [bupropion]   Review of Systems As per HPI   Physical Exam Triage Vital Signs ED Triage Vitals  Enc  Vitals Group     BP 08/02/20 0851 120/82     Pulse Rate 08/02/20 0851 100     Resp 08/02/20 0851 16     Temp 08/02/20 0851 98.1 F (36.7 C)     Temp Source 08/02/20 0851 Oral     SpO2 08/02/20 0851 97 %     Weight --      Height --      Head Circumference --      Peak Flow --      Pain Score 08/02/20 0902 5     Pain Loc --      Pain Edu? --      Excl. in [place]? --    No data found.  Updated Vital Signs BP 120/82 (BP Location: Left Arm)   Pulse 100   Temp 98.1 F (36.7 C) (Oral)   Resp 16   SpO2 97%   Visual Acuity Right Eye Distance:   Left Eye Distance:   Bilateral Distance:    Right Eye Near:   Left Eye Near:    Bilateral Near:     Physical Exam Constitutional:      General: She is not in acute distress. HENT:     Head: Normocephalic and atraumatic.     Jaw: There is normal jaw occlusion. No tenderness or pain on movement.     Right Ear: Hearing, tympanic membrane, ear canal  and external ear normal. No tenderness. No mastoid tenderness.     Left Ear: Hearing, tympanic membrane, ear canal and external ear normal. No tenderness. No mastoid tenderness.     Nose: No nasal deformity, septal deviation or nasal tenderness.     Right Turbinates: Not swollen or pale.     Left Turbinates: Not swollen or pale.     Right Sinus: No maxillary sinus tenderness or frontal sinus tenderness.     Left Sinus: No maxillary sinus tenderness or frontal sinus tenderness.     Mouth/Throat:     Lips: Pink. No lesions.     Mouth: Mucous membranes are moist. No injury.     Pharynx: Oropharynx is clear. Uvula midline. No posterior oropharyngeal erythema or uvula swelling.     Tonsils: Tonsillar exudate present. No tonsillar abscesses. 3+ on the right. 3+ on the left.  Eyes:     Conjunctiva/sclera: Conjunctivae normal.     Pupils: Pupils are equal, round, and reactive to light.  Neck:     Comments: L>R Cardiovascular:     Rate and Rhythm: Normal rate and regular rhythm.  Pulmonary:     Effort: Pulmonary effort is normal. No respiratory distress.     Breath sounds: No wheezing.  Musculoskeletal:     Cervical back: Normal range of motion and neck supple. No muscular tenderness.  Lymphadenopathy:     Cervical: Cervical adenopathy present.  Neurological:     Mental Status: She is alert and oriented to person, place, and time.      UC Treatments / Results  Labs (all labs ordered are listed, but only abnormal results are displayed) Labs Reviewed  CULTURE, GROUP A STREP [facility])  POCT RAPID STREP A (OFFICE)    EKG   Radiology No results found.  Procedures Procedures (including critical care time)  Medications Ordered in UC Medications - No data to display  Initial Impression / Assessment and Plan / UC Course  I have reviewed the triage vital signs and the nursing notes.  Pertinent labs & imaging results that were available during  my care of the patient were reviewed by  me and considered in my medical decision making (see chart for details).     Patient afebrile, nontoxic, with SpO2 97%.  Rapid strep negative, culture pending.  Patient does have gross tonsillar hypertrophy with exudate, strep contacts with negative Covid testing previously.  Will defer Covid testing at this time, treat empirically for strep VS tonsillitis.  Return precautions discussed, patient verbalized understanding and is agreeable to plan. Final Clinical Impressions(s) / UC Diagnoses   Final diagnoses:  Sore throat     Discharge Instructions     Your rapid strep test was negative today.  The culture is pending.  Please look on your MyChart for test results.   We will notify you if the culture positive and outline a treatment plan at that time.   Please continue Tylenol and/or Ibuprofen as needed for fever, pain.  May try warm salt water gargles, cepacol lozenges, throat spray, warm tea or water with lemon/honey, or OTC cold relief medicine for throat discomfort.   For congestion: take a daily anti-histamine like Zyrtec, Claritin, and a oral decongestant to help with post nasal drip that may be irritating your throat.   It is important to stay hydrated: drink plenty of fluids (primarily water) to keep your throat moisturized and help further relieve irritation/discomfort.     ED Prescriptions    Medication Sig Dispense Auth. Provider   amoxicillin (AMOXIL) 500 MG capsule Take 1 capsule (500 mg total) by mouth 2 (two) times daily for 10 days. 20 capsule Hall-Potvin, [place], PA-C     PDMP not reviewed this encounter.   Hall-Potvin, [place], [place] 08/02/20 1033

## 2020-08-07 LAB — CULTURE, GROUP A STREP (THRC)

## 2020-09-08 ENCOUNTER — Other Ambulatory Visit: Payer: Self-pay

## 2020-09-08 ENCOUNTER — Ambulatory Visit
Admission: EM | Admit: 2020-09-08 | Discharge: 2020-09-08 | Disposition: A | Discharge status: Home / Self Care | Payer: Medicaid Other | Attending: Emergency Medicine | Admitting: Emergency Medicine

## 2020-09-08 DIAGNOSIS — J0391 Acute recurrent tonsillitis, unspecified: Secondary | ICD-10-CM | POA: Insufficient documentation

## 2020-09-08 LAB — POCT RAPID STREP A (OFFICE): Rapid Strep A Screen: NEGATIVE

## 2020-09-08 MED ORDER — AMOXICILLIN 500 MG PO CAPS: 500.0000 mg | ORAL_CAPSULE | Freq: Two times a day (BID) | ORAL | 0 refills | Status: AC

## 2020-09-08 NOTE — ED Triage Notes (Signed)
Pt states she woke this morning with a sore throat, white nodules, but complains of no fever or cough. Pt is aox4 and ambulatory.

## 2020-09-08 NOTE — ED Provider Notes (Signed)
[name]    CSN: [id number] Arrival date & time: 09/08/20  1036      History   Chief Complaint Chief Complaint  Patient presents with  . Sore Throat    since this am    HPI Cindy Livingston is a 34 y.o. female  Presenting for recurrent sore throat, tonsillar swelling and exudate.  Denies fever, cough, nausea or vomiting, abdominal pain.  No chest pain, palpitations.  Does have difficulty swallowing due to pain and swelling.  States it began this morning.  Last seen by me for this on 08/02/2020: Given amoxicillin for suspected tonsillitis.  Does have ENT specialist, though has not seen specialist since initial evaluation.  Did not receive Covid vaccine, declines testing today.  No known sick contacts.  Past Medical History:  Diagnosis Date  . Anxiety   . Appendicitis    appendectomy 03/20/12  . IBS (irritable bowel syndrome)   . No pertinent past medical history     There are no problems to display for this patient.   Past Surgical History:  Procedure Laterality Date  . APPENDECTOMY  03/20/2012  . CESAREAN SECTION  2004, 2007   x 2   . FOOT SURGERY Right   . LAPAROSCOPIC APPENDECTOMY  03/20/2012   Procedure: APPENDECTOMY LAPAROSCOPIC;  Surgeon: [name], DO;  Location: [facility] OR;  Service: General;  Laterality: N/A;  Lap. App.  . TUBAL LIGATION    . TYMPANOPLASTY Left 1992 and 1995    OB History    Gravida  2   Para  2   Term  1   Preterm  1   AB      Living  3     SAB      TAB      Ectopic      Multiple  1   Live Births  3            Home Medications    Prior to Admission medications   Medication Sig Start Date End Date Taking? Authorizing Provider  Cetirizine HCl 10 MG CAPS Take 1 capsule (10 mg total) by mouth daily. 12/01/19  Yes Wieters, Hallie C, PA-C  Desvenlafaxine ER (PRISTIQ) 50 MG TB24 Take 1 tablet by mouth daily. 08/31/20  Yes [provider]  traZODone (DESYREL) 50 MG tablet  08/31/20  Yes [provider]  amoxicillin (AMOXIL) 500 MG capsule Take 1 capsule (500 mg total) by mouth 2 (two) times daily for 7 days. 09/08/20 09/15/20  Hall-Potvin, [place], PA-C  fluticasone (FLONASE) 50 MCG/ACT nasal spray Place 2 sprays daily into both nostrils. 10/03/17   Cathie Hoops, Amy V, PA-C  montelukast (SINGULAIR) 10 MG tablet Take 10 mg by mouth at bedtime.    [provider]  linaclotide (LINZESS) 145 MCG CAPS capsule Take 145 mcg by mouth daily. 01/26/19 12/01/19  [provider]    Family History Family History  Problem Relation Age of Onset  . Depression Mother   . Alcohol abuse Mother   . Drug abuse Mother   . Depression Maternal Grandmother   . Hyperlipidemia Maternal Grandmother   . Cancer Maternal Grandmother     Social History Social History   Tobacco Use  . Smoking status: Current Every Day Smoker    Packs/day: 1.00    Types: Cigarettes  . Smokeless tobacco: Never Used  Vaping Use  . Vaping Use: Never used  Substance Use Topics  . Alcohol use: Yes    Comment: occ  .  Drug use: Yes    Types: Marijuana    Comment: no longer do marijuana - last use was 98months ago     Allergies   Vicodin [hydrocodone-acetaminophen], Codeine, and Wellbutrin [bupropion]   Review of Systems As per HPI   Physical Exam Triage Vital Signs ED Triage Vitals  Enc Vitals Group     BP      Pulse      Resp      Temp      Temp src      SpO2      Weight      Height      Head Circumference      Peak Flow      Pain Score      Pain Loc      Pain Edu?      Excl. in GC?    No data found.  Updated Vital Signs BP 122/83 (BP Location: Left Arm)   Pulse 94   Temp 98 F (36.7 C) (Oral)   Resp 17   LMP 08/25/2020 (Exact Date)   SpO2 99%   Visual Acuity Right Eye Distance:   Left Eye Distance:   Bilateral Distance:    Right Eye Near:   Left Eye Near:    Bilateral Near:     Physical Exam Constitutional:      General: She is not in acute distress.     Appearance: She is not ill-appearing or diaphoretic.  HENT:     Head: Normocephalic and atraumatic.     Right Ear: Tympanic membrane and ear canal normal.     Left Ear: Tympanic membrane and ear canal normal.     Mouth/Throat:     Mouth: Mucous membranes are moist.     Pharynx: Oropharynx is clear. Uvula midline. Posterior oropharyngeal erythema present. No oropharyngeal exudate or uvula swelling.     Tonsils: No tonsillar exudate or tonsillar abscesses. 3+ on the right. 3+ on the left.  Eyes:     General: No scleral icterus.    Conjunctiva/sclera: Conjunctivae normal.     Pupils: Pupils are equal, round, and reactive to light.  Neck:     Comments: Trachea midline, negative JVD Cardiovascular:     Rate and Rhythm: Normal rate and regular rhythm.     Heart sounds: No murmur heard.  No gallop.   Pulmonary:     Effort: Pulmonary effort is normal. No respiratory distress.     Breath sounds: No wheezing, rhonchi or rales.  Musculoskeletal:     Cervical back: Neck supple. No tenderness.  Lymphadenopathy:     Cervical: Cervical adenopathy present.  Skin:    Capillary Refill: Capillary refill takes less than 2 seconds.     Coloration: Skin is not jaundiced or pale.     Findings: No rash.  Neurological:     General: No focal deficit present.     Mental Status: She is alert and oriented to person, place, and time.      UC Treatments / Results  Labs (all labs ordered are listed, but only abnormal results are displayed) Labs Reviewed  CULTURE, GROUP A STREP [facility])  POCT RAPID STREP A (OFFICE)    EKG   Radiology No results found.  Procedures Procedures (including critical care time)  Medications Ordered in UC Medications - No data to display  Initial Impression / Assessment and Plan / UC Course  I have reviewed the triage vital signs and the nursing notes.  Pertinent labs &  imaging results that were available during my care of the patient were reviewed by me and  considered in my medical decision making (see chart for details).     Afebrile, nontoxic in office today.  Rapid strep negative, culture pending.  Declined Covid testing at this time.  Responded well to amoxicillin last time, will repeat today.  Stressed importance of following up with ENT for further evaluation and management of recurrent tonsillitis.  Return precautions discussed, pt verbalized understanding and is agreeable to plan. Final Clinical Impressions(s) / UC Diagnoses   Final diagnoses:  Recurrent tonsillitis     Discharge Instructions     Follow-up with your ENT doctor for further evaluation recurrent tonsillitis.    ED Prescriptions    Medication Sig Dispense Auth. Provider   amoxicillin (AMOXIL) 500 MG capsule Take 1 capsule (500 mg total) by mouth 2 (two) times daily for 7 days. 14 capsule Hall-Potvin, [place], PA-C     PDMP not reviewed this encounter.   Hall-Potvin, [place], [place] 09/08/20 1142

## 2020-09-08 NOTE — Discharge Instructions (Signed)
Follow-up with your ENT doctor for further evaluation recurrent tonsillitis.

## 2020-09-11 LAB — CULTURE, GROUP A STREP (THRC)

## 2020-10-22 DIAGNOSIS — J358 Other chronic diseases of tonsils and adenoids: Secondary | ICD-10-CM | POA: Diagnosis not present

## 2020-10-22 DIAGNOSIS — J3501 Chronic tonsillitis: Secondary | ICD-10-CM | POA: Diagnosis not present

## 2020-10-22 DIAGNOSIS — Z8669 Personal history of other diseases of the nervous system and sense organs: Secondary | ICD-10-CM | POA: Diagnosis not present

## 2020-10-22 DIAGNOSIS — F17200 Nicotine dependence, unspecified, uncomplicated: Secondary | ICD-10-CM | POA: Diagnosis not present

## 2020-10-29 DIAGNOSIS — J0100 Acute maxillary sinusitis, unspecified: Secondary | ICD-10-CM | POA: Diagnosis not present

## 2020-10-29 DIAGNOSIS — Z20822 Contact with and (suspected) exposure to covid-19: Secondary | ICD-10-CM | POA: Diagnosis not present

## 2020-11-11 DIAGNOSIS — R059 Cough, unspecified: Secondary | ICD-10-CM | POA: Diagnosis not present

## 2020-11-11 DIAGNOSIS — J029 Acute pharyngitis, unspecified: Secondary | ICD-10-CM | POA: Diagnosis not present

## 2020-12-04 DIAGNOSIS — Z1152 Encounter for screening for COVID-19: Secondary | ICD-10-CM | POA: Diagnosis not present

## 2021-01-27 DIAGNOSIS — D492 Neoplasm of unspecified behavior of bone, soft tissue, and skin: Secondary | ICD-10-CM | POA: Diagnosis not present

## 2021-02-20 DIAGNOSIS — F17200 Nicotine dependence, unspecified, uncomplicated: Secondary | ICD-10-CM | POA: Diagnosis not present

## 2021-02-20 DIAGNOSIS — J310 Chronic rhinitis: Secondary | ICD-10-CM | POA: Diagnosis not present

## 2021-02-20 DIAGNOSIS — Z8669 Personal history of other diseases of the nervous system and sense organs: Secondary | ICD-10-CM | POA: Diagnosis not present

## 2021-02-20 DIAGNOSIS — H7292 Unspecified perforation of tympanic membrane, left ear: Secondary | ICD-10-CM | POA: Diagnosis not present

## 2021-03-04 ENCOUNTER — Encounter: Payer: Self-pay | Admitting: Emergency Medicine

## 2021-03-04 ENCOUNTER — Ambulatory Visit
Admission: EM | Admit: 2021-03-04 | Discharge: 2021-03-04 | Disposition: A | Discharge status: Home / Self Care | Payer: Medicaid Other | Attending: Family Medicine | Admitting: Family Medicine

## 2021-03-04 ENCOUNTER — Other Ambulatory Visit: Payer: Self-pay

## 2021-03-04 ENCOUNTER — Ambulatory Visit: Admit: 2021-03-04 | Disposition: A | Discharge status: Home / Self Care | Payer: Medicaid Other

## 2021-03-04 DIAGNOSIS — R059 Cough, unspecified: Secondary | ICD-10-CM

## 2021-03-04 DIAGNOSIS — R0982 Postnasal drip: Secondary | ICD-10-CM

## 2021-03-04 MED ORDER — PSEUDOEPH-BROMPHEN-DM 30-2-10 MG/5ML PO SYRP: 5.0000 mL | ORAL_SOLUTION | Freq: Three times a day (TID) | ORAL | 0 refills | Status: AC | PRN

## 2021-03-04 NOTE — ED Triage Notes (Signed)
Pt is present today with a sore throat and a cough that started one week. Pt states that she finished her round of antibiotics and prednisone.

## 2021-03-04 NOTE — ED Provider Notes (Signed)
[name]    CSN: [id number] Arrival date & time: 03/04/21  1517      History   Chief Complaint Chief Complaint  Patient presents with  . Cough  . Sore Throat    HPI Cindy Livingston is a 35 y.o. female.   HPI Patient patient evaluated by primary care provider and treated with a round of prednisone and amoxicillin last week.  She reports when she was initially treated she did not have a persistent cough however after initiation of antibiotics and prednisone she developed a cough that has been worrisome along with sensation of something stuck in her throat.  She is prescribed Singulair however stopped taking the medication while she was taking the prednisone.  She is a smoker and has a history of mild asthma.  She denies any persistent wheezing however has used her albuterol inhaler a few times over the last few days prior to bedtime.  Cough is worse at bedtime.  She has been unable to sleep do the irritation in her throat and persistent cough.  Past Medical History:  Diagnosis Date  . Anxiety   . Appendicitis    appendectomy 03/20/12  . IBS (irritable bowel syndrome)   . No pertinent past medical history     There are no problems to display for this patient.   Past Surgical History:  Procedure Laterality Date  . APPENDECTOMY  03/20/2012  . CESAREAN SECTION  2004, 2007   x 2   . FOOT SURGERY Right   . LAPAROSCOPIC APPENDECTOMY  03/20/2012   Procedure: APPENDECTOMY LAPAROSCOPIC;  Surgeon: [name], DO;  Location: [facility] OR;  Service: General;  Laterality: N/A;  Lap. App.  . TUBAL LIGATION    . TYMPANOPLASTY Left 1992 and 1995    OB History    Gravida  2   Para  2   Term  1   Preterm  1   AB      Living  3     SAB      IAB      Ectopic      Multiple  1   Live Births  3            Home Medications    Prior to Admission medications   Medication Sig Start Date End Date Taking? Authorizing Provider   brompheniramine-pseudoephedrine-DM 30-2-10 MG/5ML syrup Take 5 mLs by mouth 3 (three) times daily as needed. 03/04/21  Yes Bing Neighbors, FNP  Cetirizine HCl 10 MG CAPS Take 1 capsule (10 mg total) by mouth daily. 12/01/19   Wieters, Hallie C, PA-C  Desvenlafaxine ER (PRISTIQ) 50 MG TB24 Take 1 tablet by mouth daily. 08/31/20   [provider]  fluticasone (FLONASE) 50 MCG/ACT nasal spray Place 2 sprays daily into both nostrils. 10/03/17   Cathie Hoops, Amy V, PA-C  montelukast (SINGULAIR) 10 MG tablet Take 10 mg by mouth at bedtime.    [provider]  traZODone (DESYREL) 50 MG tablet  08/31/20   [provider]  linaclotide (LINZESS) 145 MCG CAPS capsule Take 145 mcg by mouth daily. 01/26/19 12/01/19  [provider]    Family History Family History  Problem Relation Age of Onset  . Depression Mother   . Alcohol abuse Mother   . Drug abuse Mother   . Depression Maternal Grandmother   . Hyperlipidemia Maternal Grandmother   . Cancer Maternal Grandmother     Social History Social History   Tobacco Use  .  Smoking status: Current Every Day Smoker    Packs/day: 1.00    Types: Cigarettes  . Smokeless tobacco: Never Used  Vaping Use  . Vaping Use: Never used  Substance Use Topics  . Alcohol use: Yes    Comment: occ  . Drug use: Yes    Types: Marijuana    Comment: no longer do marijuana - last use was 74months ago     Allergies   Vicodin [hydrocodone-acetaminophen], Codeine, and Wellbutrin [bupropion]   Review of Systems Review of Systems Pertinent negatives listed in HPI  Physical Exam Triage Vital Signs ED Triage Vitals  Enc Vitals Group     BP      Pulse      Resp      Temp      Temp src      SpO2      Weight      Height      Head Circumference      Peak Flow      Pain Score      Pain Loc      Pain Edu?      Excl. in GC?    No data found.  Updated Vital Signs BP 113/80 (BP Location: Left Arm)   Pulse 96   Temp 98 F (36.7  C) (Oral)   Resp 19   SpO2 97%   Visual Acuity Right Eye Distance:   Left Eye Distance:   Bilateral Distance:    Right Eye Near:   Left Eye Near:    Bilateral Near:     Physical Exam  General Appearance:    Alert, cooperative, no distress  HENT:   Normocephalic, ears normal, nares mucosal edema with congestion, oropharynx clear   Eyes:    PERRL, conjunctiva/corneas clear, EOM's intact       Lungs:     Clear to auscultation bilaterally, respirations unlabored, persistent dry cough present during exam   Heart:    Regular rate and rhythm  Neurologic:   Awake, alert, oriented x 3. No apparent focal neurological           defect.      UC Treatments / Results  Labs (all labs ordered are listed, but only abnormal results are displayed) Labs Reviewed - No data to display  EKG   Radiology No results found.  Procedures Procedures (including critical care time)  Medications Ordered in UC Medications - No data to display  Initial Impression / Assessment and Plan / UC Course  I have reviewed the triage vital signs and the nursing notes.  Pertinent labs & imaging results that were available during my care of the patient were reviewed by me and considered in my medical decision making (see chart for details).     Overall exam reassuring. Patient has persistent cough noted during exam and nasal congestion which is likely the exacerbating factor causing the throat irritation and persistent cough.  Cough and throat irritation is likely also exacerbated by patient's smoking advised to resume her Singulair have prescribed Bromfed which she can take 5 mL 3 times daily as needed for cough and nasal symptoms.  Resume use of albuterol inhaler as needed for any shortness of breath or wheezing.  Complete remaining dose of prednisone.  Hydrate well with water.  Follow-up with primary care provider if symptoms worsen or do not improve. Recent viral testing including COVID and strep negative at  PCP office. Final Clinical Impressions(s) / UC Diagnoses   Final diagnoses:  Cough  Post-nasal drainage     Discharge Instructions     Resume Singulair.  I have prescribed you both Bromfed he can take 5 mL 3 times daily as needed for cough and nasal symptoms.  Hydrate well with water.  Use albuterol inhaler as needed for shortness of breath or wheezing.  Follow-up with primary care provider symptoms worsen or do not improve.    ED Prescriptions    Medication Sig Dispense Auth. Provider   brompheniramine-pseudoephedrine-DM 30-2-10 MG/5ML syrup Take 5 mLs by mouth 3 (three) times daily as needed. 140 mL Bing Neighbors, FNP     PDMP not reviewed this encounter.   Bing Neighbors, FNP 03/04/21 1651

## 2021-03-04 NOTE — Discharge Instructions (Addendum)
Resume Singulair.  I have prescribed you both Bromfed he can take 5 mL 3 times daily as needed for cough and nasal symptoms.  Hydrate well with water.  Use albuterol inhaler as needed for shortness of breath or wheezing.  Follow-up with primary care provider symptoms worsen or do not improve.

## 2021-03-16 DIAGNOSIS — Z20822 Contact with and (suspected) exposure to covid-19: Secondary | ICD-10-CM | POA: Diagnosis not present

## 2021-03-16 DIAGNOSIS — R059 Cough, unspecified: Secondary | ICD-10-CM | POA: Diagnosis not present

## 2021-04-04 ENCOUNTER — Other Ambulatory Visit: Payer: Self-pay

## 2021-04-04 ENCOUNTER — Ambulatory Visit (HOSPITAL_COMMUNITY)
Admission: EM | Admit: 2021-04-04 | Discharge: 2021-04-04 | Disposition: A | Discharge status: Home / Self Care | Payer: Medicaid Other

## 2021-04-04 ENCOUNTER — Encounter (HOSPITAL_COMMUNITY): Payer: Self-pay | Admitting: *Deleted

## 2021-04-04 DIAGNOSIS — R1012 Left upper quadrant pain: Secondary | ICD-10-CM | POA: Diagnosis not present

## 2021-04-04 NOTE — ED Triage Notes (Signed)
Pt reports Lt sided ABD pain  For 3 weeks and now reports  Feeling a lump on Lt side

## 2021-04-04 NOTE — ED Provider Notes (Signed)
[facility]    CSN: [id number] Arrival date & time: 04/04/21  1654      History   Chief Complaint Chief Complaint  Patient presents with  . Abdominal Pain    HPI Cindy Livingston is a 35 y.o. female.   Patient here c/w abdominal pain x 3 weeks, worsening. Pain 3/10 on pain scale.  Recently returned from vacation and notes she had pain entire week.  Pain located LUQ.  Denies f/c, n/v/d/c, hematocheiza, melena.  No advil or tylenol.  PSH: lap appendectomy, C-section, tubal ligation.  She has appointment with PCP in 4 days.     Past Medical History:  Diagnosis Date  . Anxiety   . Appendicitis    appendectomy 03/20/12  . IBS (irritable bowel syndrome)   . No pertinent past medical history     There are no problems to display for this patient.   Past Surgical History:  Procedure Laterality Date  . APPENDECTOMY  03/20/2012  . CESAREAN SECTION  2004, 2007   x 2   . FOOT SURGERY Right   . LAPAROSCOPIC APPENDECTOMY  03/20/2012   Procedure: APPENDECTOMY LAPAROSCOPIC;  Surgeon: [name], DO;  Location: [facility] OR;  Service: General;  Laterality: N/A;  Lap. App.  . TUBAL LIGATION    . TYMPANOPLASTY Left 1992 and 1995    OB History    Gravida  2   Para  2   Term  1   Preterm  1   AB      Living  3     SAB      IAB      Ectopic      Multiple  1   Live Births  3            Home Medications    Prior to Admission medications   Medication Sig Start Date End Date Taking? Authorizing Provider  brompheniramine-pseudoephedrine-DM 30-2-10 MG/5ML syrup Take 5 mLs by mouth 3 (three) times daily as needed. 03/04/21   Bing Neighbors, FNP  Cetirizine HCl 10 MG CAPS Take 1 capsule (10 mg total) by mouth daily. 12/01/19   Wieters, Hallie C, PA-C  Desvenlafaxine ER (PRISTIQ) 50 MG TB24 Take 1 tablet by mouth daily. 08/31/20   [provider]  fluticasone (FLONASE) 50 MCG/ACT nasal spray Place 2 sprays daily into both nostrils. 10/03/17   Cathie Hoops, Amy  V, PA-C  montelukast (SINGULAIR) 10 MG tablet Take 10 mg by mouth at bedtime.    [provider]  traZODone (DESYREL) 50 MG tablet  08/31/20   [provider]  linaclotide (LINZESS) 145 MCG CAPS capsule Take 145 mcg by mouth daily. 01/26/19 12/01/19  [provider]    Family History Family History  Problem Relation Age of Onset  . Depression Mother   . Alcohol abuse Mother   . Drug abuse Mother   . Depression Maternal Grandmother   . Hyperlipidemia Maternal Grandmother   . Cancer Maternal Grandmother     Social History Social History   Tobacco Use  . Smoking status: Current Every Day Smoker    Packs/day: 1.00    Types: Cigarettes  . Smokeless tobacco: Never Used  Vaping Use  . Vaping Use: Never used  Substance Use Topics  . Alcohol use: Yes    Comment: occ  . Drug use: Yes    Types: Marijuana    Comment: no longer do marijuana - last use was 64months ago     Allergies  Vicodin [hydrocodone-acetaminophen], Codeine, and Wellbutrin [bupropion]   Review of Systems Review of Systems  Constitutional: Negative for chills, fatigue and fever.  Respiratory: Negative for cough, shortness of breath and wheezing.   Cardiovascular: Negative for chest pain.  Gastrointestinal: Positive for abdominal pain. Negative for abdominal distention, blood in stool, constipation, diarrhea, nausea and vomiting.  Musculoskeletal: Negative for arthralgias and myalgias.  Skin: Negative for rash.  Neurological: Negative for light-headedness and headaches.  Hematological: Negative for adenopathy. Does not bruise/bleed easily.  Psychiatric/Behavioral: Negative for confusion and sleep disturbance.     Physical Exam Triage Vital Signs ED Triage Vitals  Enc Vitals Group     BP 04/04/21 1708 101/79     Pulse Rate 04/04/21 1708 92     Resp 04/04/21 1708 18     Temp 04/04/21 1708 98.1 F (36.7 C)     Temp src --      SpO2 04/04/21 1708 99 %     Weight --      Height  --      Head Circumference --      Peak Flow --      Pain Score 04/04/21 1705 7     Pain Loc --      Pain Edu? --      Excl. in GC? --    No data found.  Updated Vital Signs BP 101/79   Pulse 92   Temp 98.1 F (36.7 C)   Resp 18   LMP 03/27/2021   SpO2 99%   Visual Acuity Right Eye Distance:   Left Eye Distance:   Bilateral Distance:    Right Eye Near:   Left Eye Near:    Bilateral Near:     Physical Exam Vitals and nursing note reviewed.  Constitutional:      General: She is not in acute distress.    Appearance: Normal appearance. She is not ill-appearing.  HENT:     Head: Normocephalic and atraumatic.  Eyes:     Extraocular Movements: Extraocular movements intact.     Conjunctiva/sclera: Conjunctivae normal.  Pulmonary:     Effort: Pulmonary effort is normal. No respiratory distress.  Abdominal:     Tenderness: There is abdominal tenderness (mild) in the left upper quadrant. There is no right CVA tenderness, left CVA tenderness, guarding or rebound. Negative signs include Murphy's sign, Rovsing's sign, McBurney's sign, psoas sign and obturator sign.     Hernia: No hernia is present. There is no hernia in the ventral area.  Musculoskeletal:     Cervical back: Normal range of motion. No rigidity.  Skin:    Coloration: Skin is not jaundiced.     Findings: No rash.  Neurological:     General: No focal deficit present.     Mental Status: She is alert and oriented to person, place, and time.     Motor: No weakness.     Gait: Gait normal.  Psychiatric:        Mood and Affect: Mood normal.        Behavior: Behavior normal.      UC Treatments / Results  Labs (all labs ordered are listed, but only abnormal results are displayed) Labs Reviewed - No data to display  EKG   Radiology No results found.  Procedures Procedures (including critical care time)  Medications Ordered in UC Medications - No data to display  Initial Impression / Assessment and  Plan / UC Course  I have reviewed the triage vital signs  and the nursing notes.  Pertinent labs & imaging results that were available during my care of the patient were reviewed by me and considered in my medical decision making (see chart for details).     Keep appointment with PCP  Go to ED with any worsening symptoms as discussed Final Clinical Impressions(s) / UC Diagnoses   Final diagnoses:  Left upper quadrant abdominal pain     Discharge Instructions     Keep appointment with PCP on Thursday Go to ED with worsening symptoms as discussed.    ED Prescriptions    None     PDMP not reviewed this encounter.   Evern Core, PA-C 04/04/21 1746

## 2021-04-04 NOTE — Discharge Instructions (Addendum)
Keep appointment with PCP on Thursday Go to ED with worsening symptoms as discussed.

## 2021-04-09 DIAGNOSIS — F3289 Other specified depressive episodes: Secondary | ICD-10-CM | POA: Diagnosis not present

## 2021-04-30 DIAGNOSIS — R1012 Left upper quadrant pain: Secondary | ICD-10-CM | POA: Diagnosis not present

## 2021-05-14 DIAGNOSIS — E538 Deficiency of other specified B group vitamins: Secondary | ICD-10-CM | POA: Diagnosis not present

## 2021-05-27 DIAGNOSIS — Z8669 Personal history of other diseases of the nervous system and sense organs: Secondary | ICD-10-CM | POA: Diagnosis not present

## 2021-05-27 DIAGNOSIS — J310 Chronic rhinitis: Secondary | ICD-10-CM | POA: Diagnosis not present

## 2021-06-01 DIAGNOSIS — F411 Generalized anxiety disorder: Secondary | ICD-10-CM | POA: Diagnosis not present

## 2021-06-01 DIAGNOSIS — F331 Major depressive disorder, recurrent, moderate: Secondary | ICD-10-CM | POA: Diagnosis not present

## 2021-06-01 DIAGNOSIS — F902 Attention-deficit hyperactivity disorder, combined type: Secondary | ICD-10-CM | POA: Diagnosis not present

## 2021-06-02 DIAGNOSIS — F3289 Other specified depressive episodes: Secondary | ICD-10-CM | POA: Diagnosis not present

## 2021-06-02 DIAGNOSIS — E668 Other obesity: Secondary | ICD-10-CM | POA: Diagnosis not present

## 2021-06-02 DIAGNOSIS — Z6831 Body mass index (BMI) 31.0-31.9, adult: Secondary | ICD-10-CM | POA: Diagnosis not present

## 2021-06-02 DIAGNOSIS — Z13228 Encounter for screening for other metabolic disorders: Secondary | ICD-10-CM | POA: Diagnosis not present

## 2021-06-17 DIAGNOSIS — F3289 Other specified depressive episodes: Secondary | ICD-10-CM | POA: Diagnosis not present

## 2021-06-18 DIAGNOSIS — Z113 Encounter for screening for infections with a predominantly sexual mode of transmission: Secondary | ICD-10-CM | POA: Diagnosis not present

## 2021-06-18 DIAGNOSIS — N898 Other specified noninflammatory disorders of vagina: Secondary | ICD-10-CM | POA: Diagnosis not present

## 2021-06-25 DIAGNOSIS — E538 Deficiency of other specified B group vitamins: Secondary | ICD-10-CM | POA: Diagnosis not present

## 2021-07-01 DIAGNOSIS — F331 Major depressive disorder, recurrent, moderate: Secondary | ICD-10-CM | POA: Diagnosis not present

## 2021-07-01 DIAGNOSIS — F411 Generalized anxiety disorder: Secondary | ICD-10-CM | POA: Diagnosis not present

## 2021-07-01 DIAGNOSIS — F902 Attention-deficit hyperactivity disorder, combined type: Secondary | ICD-10-CM | POA: Diagnosis not present

## 2021-07-02 DIAGNOSIS — F3289 Other specified depressive episodes: Secondary | ICD-10-CM | POA: Diagnosis not present

## 2021-07-16 DIAGNOSIS — F3289 Other specified depressive episodes: Secondary | ICD-10-CM | POA: Diagnosis not present

## 2021-07-29 DIAGNOSIS — E538 Deficiency of other specified B group vitamins: Secondary | ICD-10-CM | POA: Diagnosis not present

## 2021-08-28 DIAGNOSIS — E538 Deficiency of other specified B group vitamins: Secondary | ICD-10-CM | POA: Diagnosis not present

## 2021-09-25 DIAGNOSIS — E538 Deficiency of other specified B group vitamins: Secondary | ICD-10-CM | POA: Diagnosis not present

## 2021-09-30 DIAGNOSIS — F3289 Other specified depressive episodes: Secondary | ICD-10-CM | POA: Diagnosis not present

## 2021-10-01 DIAGNOSIS — F331 Major depressive disorder, recurrent, moderate: Secondary | ICD-10-CM | POA: Diagnosis not present

## 2021-10-01 DIAGNOSIS — F411 Generalized anxiety disorder: Secondary | ICD-10-CM | POA: Diagnosis not present

## 2021-10-01 DIAGNOSIS — F902 Attention-deficit hyperactivity disorder, combined type: Secondary | ICD-10-CM | POA: Diagnosis not present

## 2021-10-26 DIAGNOSIS — G44229 Chronic tension-type headache, not intractable: Secondary | ICD-10-CM | POA: Diagnosis not present

## 2021-10-26 DIAGNOSIS — Z888 Allergy status to other drugs, medicaments and biological substances status: Secondary | ICD-10-CM | POA: Diagnosis not present

## 2021-10-26 DIAGNOSIS — R42 Dizziness and giddiness: Secondary | ICD-10-CM | POA: Diagnosis not present

## 2021-10-26 DIAGNOSIS — Z885 Allergy status to narcotic agent status: Secondary | ICD-10-CM | POA: Diagnosis not present

## 2021-10-26 DIAGNOSIS — H90A32 Mixed conductive and sensorineural hearing loss, unilateral, left ear with restricted hearing on the contralateral side: Secondary | ICD-10-CM | POA: Diagnosis not present

## 2021-10-26 DIAGNOSIS — H9202 Otalgia, left ear: Secondary | ICD-10-CM | POA: Diagnosis not present

## 2021-10-26 DIAGNOSIS — Z9889 Other specified postprocedural states: Secondary | ICD-10-CM | POA: Diagnosis not present

## 2021-10-26 DIAGNOSIS — F419 Anxiety disorder, unspecified: Secondary | ICD-10-CM | POA: Diagnosis not present

## 2021-10-26 DIAGNOSIS — Z886 Allergy status to analgesic agent status: Secondary | ICD-10-CM | POA: Diagnosis not present

## 2021-10-26 DIAGNOSIS — H90A21 Sensorineural hearing loss, unilateral, right ear, with restricted hearing on the contralateral side: Secondary | ICD-10-CM | POA: Diagnosis not present

## 2021-10-29 DIAGNOSIS — F3174 Bipolar disorder, in full remission, most recent episode manic: Secondary | ICD-10-CM | POA: Diagnosis not present

## 2021-10-29 DIAGNOSIS — G478 Other sleep disorders: Secondary | ICD-10-CM | POA: Diagnosis not present

## 2021-10-29 DIAGNOSIS — R42 Dizziness and giddiness: Secondary | ICD-10-CM | POA: Diagnosis not present

## 2021-11-05 DIAGNOSIS — E538 Deficiency of other specified B group vitamins: Secondary | ICD-10-CM | POA: Diagnosis not present

## 2021-11-24 DIAGNOSIS — G459 Transient cerebral ischemic attack, unspecified: Secondary | ICD-10-CM | POA: Diagnosis not present

## 2021-12-07 DIAGNOSIS — E538 Deficiency of other specified B group vitamins: Secondary | ICD-10-CM | POA: Diagnosis not present

## 2021-12-15 DIAGNOSIS — J029 Acute pharyngitis, unspecified: Secondary | ICD-10-CM | POA: Diagnosis not present

## 2021-12-15 DIAGNOSIS — R059 Cough, unspecified: Secondary | ICD-10-CM | POA: Diagnosis not present

## 2021-12-15 DIAGNOSIS — J329 Chronic sinusitis, unspecified: Secondary | ICD-10-CM | POA: Diagnosis not present

## 2021-12-15 DIAGNOSIS — Z720 Tobacco use: Secondary | ICD-10-CM | POA: Diagnosis not present

## 2021-12-15 DIAGNOSIS — J3089 Other allergic rhinitis: Secondary | ICD-10-CM | POA: Diagnosis not present

## 2021-12-21 DIAGNOSIS — E538 Deficiency of other specified B group vitamins: Secondary | ICD-10-CM | POA: Diagnosis not present

## 2021-12-21 DIAGNOSIS — R519 Headache, unspecified: Secondary | ICD-10-CM | POA: Diagnosis not present

## 2021-12-21 DIAGNOSIS — R635 Abnormal weight gain: Secondary | ICD-10-CM | POA: Diagnosis not present

## 2021-12-21 DIAGNOSIS — R202 Paresthesia of skin: Secondary | ICD-10-CM | POA: Diagnosis not present

## 2021-12-21 DIAGNOSIS — Z6833 Body mass index (BMI) 33.0-33.9, adult: Secondary | ICD-10-CM | POA: Diagnosis not present

## 2021-12-21 DIAGNOSIS — Z113 Encounter for screening for infections with a predominantly sexual mode of transmission: Secondary | ICD-10-CM | POA: Diagnosis not present

## 2021-12-21 DIAGNOSIS — E785 Hyperlipidemia, unspecified: Secondary | ICD-10-CM | POA: Diagnosis not present

## 2021-12-21 DIAGNOSIS — E786 Lipoprotein deficiency: Secondary | ICD-10-CM | POA: Diagnosis not present

## 2021-12-21 DIAGNOSIS — G5622 Lesion of ulnar nerve, left upper limb: Secondary | ICD-10-CM | POA: Diagnosis not present

## 2022-01-08 DIAGNOSIS — G5622 Lesion of ulnar nerve, left upper limb: Secondary | ICD-10-CM | POA: Diagnosis not present

## 2022-01-25 DIAGNOSIS — E785 Hyperlipidemia, unspecified: Secondary | ICD-10-CM | POA: Diagnosis not present

## 2022-01-25 DIAGNOSIS — E669 Obesity, unspecified: Secondary | ICD-10-CM | POA: Diagnosis not present

## 2022-01-25 DIAGNOSIS — F3174 Bipolar disorder, in full remission, most recent episode manic: Secondary | ICD-10-CM | POA: Diagnosis not present

## 2022-01-28 DIAGNOSIS — H9192 Unspecified hearing loss, left ear: Secondary | ICD-10-CM | POA: Diagnosis not present

## 2022-01-28 DIAGNOSIS — G44229 Chronic tension-type headache, not intractable: Secondary | ICD-10-CM | POA: Diagnosis not present

## 2022-01-28 DIAGNOSIS — Z9889 Other specified postprocedural states: Secondary | ICD-10-CM | POA: Diagnosis not present

## 2022-01-28 DIAGNOSIS — F419 Anxiety disorder, unspecified: Secondary | ICD-10-CM | POA: Diagnosis not present

## 2022-01-28 DIAGNOSIS — H9212 Otorrhea, left ear: Secondary | ICD-10-CM | POA: Diagnosis not present

## 2022-01-28 DIAGNOSIS — M898X9 Other specified disorders of bone, unspecified site: Secondary | ICD-10-CM | POA: Diagnosis not present

## 2022-01-28 DIAGNOSIS — Z885 Allergy status to narcotic agent status: Secondary | ICD-10-CM | POA: Diagnosis not present

## 2022-01-28 DIAGNOSIS — R42 Dizziness and giddiness: Secondary | ICD-10-CM | POA: Diagnosis not present

## 2022-01-28 DIAGNOSIS — Z886 Allergy status to analgesic agent status: Secondary | ICD-10-CM | POA: Diagnosis not present

## 2022-01-28 DIAGNOSIS — H9072 Mixed conductive and sensorineural hearing loss, unilateral, left ear, with unrestricted hearing on the contralateral side: Secondary | ICD-10-CM | POA: Diagnosis not present

## 2022-01-28 DIAGNOSIS — H9202 Otalgia, left ear: Secondary | ICD-10-CM | POA: Diagnosis not present

## 2022-02-25 DIAGNOSIS — E538 Deficiency of other specified B group vitamins: Secondary | ICD-10-CM | POA: Diagnosis not present

## 2022-03-15 DIAGNOSIS — R233 Spontaneous ecchymoses: Secondary | ICD-10-CM | POA: Diagnosis not present

## 2022-03-15 DIAGNOSIS — R350 Frequency of micturition: Secondary | ICD-10-CM | POA: Diagnosis not present

## 2022-03-15 DIAGNOSIS — Z789 Other specified health status: Secondary | ICD-10-CM | POA: Diagnosis not present

## 2022-03-15 DIAGNOSIS — M25472 Effusion, left ankle: Secondary | ICD-10-CM | POA: Diagnosis not present

## 2022-03-29 DIAGNOSIS — E538 Deficiency of other specified B group vitamins: Secondary | ICD-10-CM | POA: Diagnosis not present

## 2022-04-05 DIAGNOSIS — F411 Generalized anxiety disorder: Secondary | ICD-10-CM | POA: Diagnosis not present

## 2022-04-05 DIAGNOSIS — F331 Major depressive disorder, recurrent, moderate: Secondary | ICD-10-CM | POA: Diagnosis not present

## 2022-04-05 DIAGNOSIS — Z79899 Other long term (current) drug therapy: Secondary | ICD-10-CM | POA: Diagnosis not present

## 2022-04-05 DIAGNOSIS — F902 Attention-deficit hyperactivity disorder, combined type: Secondary | ICD-10-CM | POA: Diagnosis not present

## 2022-04-12 DIAGNOSIS — R109 Unspecified abdominal pain: Secondary | ICD-10-CM | POA: Diagnosis not present

## 2022-04-12 DIAGNOSIS — R197 Diarrhea, unspecified: Secondary | ICD-10-CM | POA: Diagnosis not present

## 2022-04-20 DIAGNOSIS — R197 Diarrhea, unspecified: Secondary | ICD-10-CM | POA: Diagnosis not present

## 2022-04-26 DIAGNOSIS — Z9889 Other specified postprocedural states: Secondary | ICD-10-CM | POA: Diagnosis not present

## 2022-04-26 DIAGNOSIS — E785 Hyperlipidemia, unspecified: Secondary | ICD-10-CM | POA: Diagnosis not present

## 2022-04-26 DIAGNOSIS — E538 Deficiency of other specified B group vitamins: Secondary | ICD-10-CM | POA: Diagnosis not present

## 2022-04-26 DIAGNOSIS — Z6833 Body mass index (BMI) 33.0-33.9, adult: Secondary | ICD-10-CM | POA: Diagnosis not present

## 2022-05-14 ENCOUNTER — Ambulatory Visit (HOSPITAL_COMMUNITY)
Admission: EM | Admit: 2022-05-14 | Discharge: 2022-05-14 | Disposition: A | Discharge status: Home / Self Care | Payer: Medicaid Other | Attending: Family Medicine | Admitting: Family Medicine

## 2022-05-14 ENCOUNTER — Encounter (HOSPITAL_COMMUNITY): Payer: Self-pay

## 2022-05-14 ENCOUNTER — Ambulatory Visit (INDEPENDENT_AMBULATORY_CARE_PROVIDER_SITE_OTHER): Payer: Medicaid Other

## 2022-05-14 DIAGNOSIS — R079 Chest pain, unspecified: Secondary | ICD-10-CM | POA: Diagnosis not present

## 2022-05-14 DIAGNOSIS — R1012 Left upper quadrant pain: Secondary | ICD-10-CM | POA: Diagnosis not present

## 2022-05-14 MED ORDER — FAMOTIDINE 20 MG PO TABS: 20.0000 mg | ORAL_TABLET | Freq: Two times a day (BID) | ORAL | 0 refills | Status: AC

## 2022-05-14 MED ORDER — IBUPROFEN 600 MG PO TABS: 600.0000 mg | ORAL_TABLET | Freq: Three times a day (TID) | ORAL | 0 refills | Status: AC | PRN

## 2022-05-14 NOTE — ED Triage Notes (Signed)
Pt states left upper abd pain for the past week. States last night she started having left sided chest and arm pain that radiates into her left shoulder.

## 2022-05-17 DIAGNOSIS — F331 Major depressive disorder, recurrent, moderate: Secondary | ICD-10-CM | POA: Diagnosis not present

## 2022-05-17 DIAGNOSIS — F411 Generalized anxiety disorder: Secondary | ICD-10-CM | POA: Diagnosis not present

## 2022-05-17 DIAGNOSIS — F902 Attention-deficit hyperactivity disorder, combined type: Secondary | ICD-10-CM | POA: Diagnosis not present

## 2022-05-31 DIAGNOSIS — J3089 Other allergic rhinitis: Secondary | ICD-10-CM | POA: Diagnosis not present

## 2022-05-31 DIAGNOSIS — H66002 Acute suppurative otitis media without spontaneous rupture of ear drum, left ear: Secondary | ICD-10-CM | POA: Diagnosis not present

## 2022-05-31 DIAGNOSIS — J329 Chronic sinusitis, unspecified: Secondary | ICD-10-CM | POA: Diagnosis not present

## 2022-05-31 DIAGNOSIS — J4521 Mild intermittent asthma with (acute) exacerbation: Secondary | ICD-10-CM | POA: Diagnosis not present

## 2022-07-05 DIAGNOSIS — E538 Deficiency of other specified B group vitamins: Secondary | ICD-10-CM | POA: Diagnosis not present

## 2022-08-05 DIAGNOSIS — E538 Deficiency of other specified B group vitamins: Secondary | ICD-10-CM | POA: Diagnosis not present

## 2022-08-17 DIAGNOSIS — F902 Attention-deficit hyperactivity disorder, combined type: Secondary | ICD-10-CM | POA: Diagnosis not present

## 2022-08-17 DIAGNOSIS — F411 Generalized anxiety disorder: Secondary | ICD-10-CM | POA: Diagnosis not present

## 2022-08-17 DIAGNOSIS — F331 Major depressive disorder, recurrent, moderate: Secondary | ICD-10-CM | POA: Diagnosis not present

## 2022-08-18 ENCOUNTER — Encounter: Payer: Self-pay | Admitting: Family Medicine

## 2022-08-26 DIAGNOSIS — Z9049 Acquired absence of other specified parts of digestive tract: Secondary | ICD-10-CM | POA: Diagnosis not present

## 2022-08-26 DIAGNOSIS — Z9889 Other specified postprocedural states: Secondary | ICD-10-CM | POA: Diagnosis not present

## 2022-08-26 DIAGNOSIS — F419 Anxiety disorder, unspecified: Secondary | ICD-10-CM | POA: Diagnosis not present

## 2022-08-26 DIAGNOSIS — R42 Dizziness and giddiness: Secondary | ICD-10-CM | POA: Diagnosis not present

## 2022-08-26 DIAGNOSIS — H9202 Otalgia, left ear: Secondary | ICD-10-CM | POA: Diagnosis not present

## 2022-08-26 DIAGNOSIS — H9072 Mixed conductive and sensorineural hearing loss, unilateral, left ear, with unrestricted hearing on the contralateral side: Secondary | ICD-10-CM | POA: Diagnosis not present

## 2022-08-26 DIAGNOSIS — F17210 Nicotine dependence, cigarettes, uncomplicated: Secondary | ICD-10-CM | POA: Diagnosis not present

## 2022-09-06 DIAGNOSIS — E538 Deficiency of other specified B group vitamins: Secondary | ICD-10-CM | POA: Diagnosis not present

## 2022-09-07 DIAGNOSIS — Z0000 Encounter for general adult medical examination without abnormal findings: Secondary | ICD-10-CM | POA: Diagnosis not present

## 2022-09-07 DIAGNOSIS — Z9851 Tubal ligation status: Secondary | ICD-10-CM | POA: Diagnosis not present

## 2022-09-07 DIAGNOSIS — Z113 Encounter for screening for infections with a predominantly sexual mode of transmission: Secondary | ICD-10-CM | POA: Diagnosis not present

## 2022-09-07 DIAGNOSIS — Z9889 Other specified postprocedural states: Secondary | ICD-10-CM | POA: Diagnosis not present

## 2022-09-07 DIAGNOSIS — N898 Other specified noninflammatory disorders of vagina: Secondary | ICD-10-CM | POA: Diagnosis not present

## 2022-09-07 DIAGNOSIS — Z124 Encounter for screening for malignant neoplasm of cervix: Secondary | ICD-10-CM | POA: Diagnosis not present

## 2022-09-09 DIAGNOSIS — F331 Major depressive disorder, recurrent, moderate: Secondary | ICD-10-CM | POA: Diagnosis not present

## 2022-09-09 DIAGNOSIS — F411 Generalized anxiety disorder: Secondary | ICD-10-CM | POA: Diagnosis not present

## 2022-09-09 DIAGNOSIS — F633 Trichotillomania: Secondary | ICD-10-CM | POA: Diagnosis not present

## 2022-09-27 DIAGNOSIS — F331 Major depressive disorder, recurrent, moderate: Secondary | ICD-10-CM | POA: Diagnosis not present

## 2022-09-27 DIAGNOSIS — R319 Hematuria, unspecified: Secondary | ICD-10-CM | POA: Diagnosis not present

## 2022-09-27 DIAGNOSIS — E785 Hyperlipidemia, unspecified: Secondary | ICD-10-CM | POA: Diagnosis not present

## 2022-09-27 DIAGNOSIS — F411 Generalized anxiety disorder: Secondary | ICD-10-CM | POA: Diagnosis not present

## 2022-09-27 DIAGNOSIS — F902 Attention-deficit hyperactivity disorder, combined type: Secondary | ICD-10-CM | POA: Diagnosis not present

## 2022-09-27 DIAGNOSIS — F3174 Bipolar disorder, in full remission, most recent episode manic: Secondary | ICD-10-CM | POA: Diagnosis not present

## 2022-09-27 DIAGNOSIS — Z2820 Immunization not carried out because of patient decision for unspecified reason: Secondary | ICD-10-CM | POA: Diagnosis not present

## 2022-09-27 DIAGNOSIS — Z6833 Body mass index (BMI) 33.0-33.9, adult: Secondary | ICD-10-CM | POA: Diagnosis not present

## 2022-09-27 DIAGNOSIS — Z0000 Encounter for general adult medical examination without abnormal findings: Secondary | ICD-10-CM | POA: Diagnosis not present

## 2022-09-27 DIAGNOSIS — J4520 Mild intermittent asthma, uncomplicated: Secondary | ICD-10-CM | POA: Diagnosis not present

## 2022-09-27 DIAGNOSIS — J3089 Other allergic rhinitis: Secondary | ICD-10-CM | POA: Diagnosis not present

## 2022-09-27 DIAGNOSIS — K219 Gastro-esophageal reflux disease without esophagitis: Secondary | ICD-10-CM | POA: Diagnosis not present

## 2022-09-29 DIAGNOSIS — Z9889 Other specified postprocedural states: Secondary | ICD-10-CM | POA: Diagnosis not present

## 2022-09-29 DIAGNOSIS — Z124 Encounter for screening for malignant neoplasm of cervix: Secondary | ICD-10-CM | POA: Diagnosis not present

## 2022-09-29 DIAGNOSIS — R87615 Unsatisfactory cytologic smear of cervix: Secondary | ICD-10-CM | POA: Diagnosis not present

## 2022-10-07 DIAGNOSIS — E538 Deficiency of other specified B group vitamins: Secondary | ICD-10-CM | POA: Diagnosis not present

## 2022-11-08 DIAGNOSIS — E538 Deficiency of other specified B group vitamins: Secondary | ICD-10-CM | POA: Diagnosis not present

## 2022-11-29 DIAGNOSIS — F3289 Other specified depressive episodes: Secondary | ICD-10-CM | POA: Diagnosis not present

## 2022-12-08 DIAGNOSIS — F3289 Other specified depressive episodes: Secondary | ICD-10-CM | POA: Diagnosis not present

## 2022-12-09 DIAGNOSIS — E538 Deficiency of other specified B group vitamins: Secondary | ICD-10-CM | POA: Diagnosis not present

## 2022-12-20 DIAGNOSIS — F3289 Other specified depressive episodes: Secondary | ICD-10-CM | POA: Diagnosis not present

## 2022-12-27 DIAGNOSIS — R252 Cramp and spasm: Secondary | ICD-10-CM | POA: Diagnosis not present

## 2022-12-27 DIAGNOSIS — E785 Hyperlipidemia, unspecified: Secondary | ICD-10-CM | POA: Diagnosis not present

## 2022-12-27 DIAGNOSIS — M7918 Myalgia, other site: Secondary | ICD-10-CM | POA: Diagnosis not present

## 2022-12-27 DIAGNOSIS — E538 Deficiency of other specified B group vitamins: Secondary | ICD-10-CM | POA: Diagnosis not present

## 2022-12-27 DIAGNOSIS — R635 Abnormal weight gain: Secondary | ICD-10-CM | POA: Diagnosis not present

## 2022-12-27 DIAGNOSIS — L719 Rosacea, unspecified: Secondary | ICD-10-CM | POA: Diagnosis not present

## 2022-12-27 DIAGNOSIS — F3174 Bipolar disorder, in full remission, most recent episode manic: Secondary | ICD-10-CM | POA: Diagnosis not present

## 2022-12-27 DIAGNOSIS — M5442 Lumbago with sciatica, left side: Secondary | ICD-10-CM | POA: Diagnosis not present

## 2022-12-27 DIAGNOSIS — F419 Anxiety disorder, unspecified: Secondary | ICD-10-CM | POA: Diagnosis not present

## 2022-12-27 DIAGNOSIS — L989 Disorder of the skin and subcutaneous tissue, unspecified: Secondary | ICD-10-CM | POA: Diagnosis not present

## 2022-12-27 DIAGNOSIS — Z6833 Body mass index (BMI) 33.0-33.9, adult: Secondary | ICD-10-CM | POA: Diagnosis not present

## 2023-01-05 DIAGNOSIS — F3289 Other specified depressive episodes: Secondary | ICD-10-CM | POA: Diagnosis not present

## 2023-01-10 DIAGNOSIS — E538 Deficiency of other specified B group vitamins: Secondary | ICD-10-CM | POA: Diagnosis not present

## 2023-01-10 DIAGNOSIS — F902 Attention-deficit hyperactivity disorder, combined type: Secondary | ICD-10-CM | POA: Diagnosis not present

## 2023-01-10 DIAGNOSIS — Z79899 Other long term (current) drug therapy: Secondary | ICD-10-CM | POA: Diagnosis not present

## 2023-01-10 DIAGNOSIS — F331 Major depressive disorder, recurrent, moderate: Secondary | ICD-10-CM | POA: Diagnosis not present

## 2023-01-10 DIAGNOSIS — F411 Generalized anxiety disorder: Secondary | ICD-10-CM | POA: Diagnosis not present

## 2023-01-17 ENCOUNTER — Ambulatory Visit: Payer: Medicaid Other | Attending: Physician Assistant | Admitting: Physical Therapy

## 2023-01-17 ENCOUNTER — Encounter: Payer: Self-pay | Admitting: Physical Therapy

## 2023-01-17 DIAGNOSIS — M5459 Other low back pain: Secondary | ICD-10-CM | POA: Insufficient documentation

## 2023-01-17 DIAGNOSIS — M7918 Myalgia, other site: Secondary | ICD-10-CM | POA: Insufficient documentation

## 2023-01-17 DIAGNOSIS — M6281 Muscle weakness (generalized): Secondary | ICD-10-CM | POA: Insufficient documentation

## 2023-01-17 DIAGNOSIS — R293 Abnormal posture: Secondary | ICD-10-CM | POA: Insufficient documentation

## 2023-01-17 NOTE — Therapy (Signed)
OUTPATIENT PHYSICAL THERAPY LOWER EXTREMITY EVALUATION   Patient Name: Cindy Livingston MRN: [id number] DOB:01/25/1986, 37 y.o., female, female Today's Date: 01/17/2023  END OF SESSION:  PT End of Session - 01/17/23 1856     Visit Number 1    Date for PT Re-Evaluation 03/28/23    PT Start Time 1802    PT Stop Time 1842    PT Time Calculation (min) 40 min    Activity Tolerance Patient tolerated treatment well    Behavior During Therapy [facility] for tasks assessed/performed             Past Medical History:  Diagnosis Date   Anxiety    Appendicitis    appendectomy 03/20/12   IBS (irritable bowel syndrome)    No pertinent past medical history    Past Surgical History:  Procedure Laterality Date   APPENDECTOMY  03/20/2012   CESAREAN SECTION  2004, 2007   x 2    FOOT SURGERY Right    LAPAROSCOPIC APPENDECTOMY  03/20/2012   Procedure: APPENDECTOMY LAPAROSCOPIC;  Surgeon: Madilyn Hook, DO;  Location: [place];  Service: General;  Laterality: N/A;  Lap. App.   TUBAL LIGATION     TYMPANOPLASTY Left 1992 and 1995   There are no problems to display for this patient.   PCP: [place] [facility]  REFERRING PROVIDER: Harrison Mons, PA  REFERRING DIAG: M79.18 (ICD-10-CM) - Myalgia, other site   THERAPY DIAG:  Diffuse myofascial pain syndrome  Abnormal posture  Muscle weakness (generalized)  Other low back pain  Rationale for Evaluation and Treatment: Rehabilitation  ONSET DATE: 12/30/22  SUBJECTIVE:   SUBJECTIVE STATEMENT: Patient reports that her hips feel tight and lower back, L medial scapula have all been bothering her. Housework increases the pain.  PERTINENT HISTORY: Bipolar, BMI 33.0-33.9,adult, HOH L, Depression, Chronic left-sided low back pain with left-sided sciatica, ADD, sacroiliitis  PAIN:  Are you having pain? Yes: NPRS scale: 5-6/10 Pain location: Multiple areas Pain description: cramping, spasming Aggravating factors: Activity-housework, standing Relieving factors:  Rest  PRECAUTIONS: None  WEIGHT BEARING RESTRICTIONS: No  FALLS:  Has patient fallen in last 6 months? 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 She spends some time at her desk, but also has to walk and grab parts at times.  PLOF: Independent  PATIENT GOALS: Improve pain, learn to stretch to loosen up.  NEXT MD VISIT: 4 weeks  OBJECTIVE:   DIAGNOSTIC FINDINGS: N/A  COGNITION: Overall cognitive status: Within functional limits for tasks assessed     SENSATION: WFL  MUSCLE LENGTH: Hamstrings: Right 45 deg; Left 40 deg Thomas test: B tight hip flexors  POSTURE: increased lumbar lordosis and anterior pelvic tilt  PALPATION: Tightness in B gluts and piriformis, no TTP, TTP L medial scap border  LOWER EXTREMITY ROM: WFL   LOWER EXTREMITY MMT:  MMT Right eval Left eval  Hip flexion 3+ 3+  Hip extension 3+ 3+  Hip abduction 4 4  Hip adduction    Hip internal rotation    Hip external rotation    Knee flexion 5 5  Knee extension 5 5  Ankle dorsiflexion 5 5  Ankle plantarflexion    Ankle inversion    Ankle eversion     (Blank rows = not tested)  BACK SPECIAL TESTS: SLR, slump test-both neg   FUNCTIONAL TESTS:  5 times sit to stand: 12.34 MCTSIB: Condition 1: Avg of 3 trials: 30 sec, Condition 2: Avg of 3 trials: 0 sec, Condition 3: Avg of 3 trials: 30 sec, Condition 4: Avg of 3 trials: 30 sec, and Total Score: 120/120  GAIT: Distance walked: In clinic distances Assistive device utilized: None Level of assistance: Complete Independence Comments: no abnormalities   TODAY'S TREATMENT:                                                                                                                              DATE:  01/17/23  Education  PATIENT EDUCATION:   Education details: POC Person educated: Patient Education method: Explanation Education comprehension: verbalized understanding  HOME EXERCISE PROGRAM: TBD  ASSESSMENT:  CLINICAL IMPRESSION: Patient is a 37 y.o. who was seen today for physical therapy evaluation and treatment for myalgia. Her pain is mostly in her L medial scapular border, L hip and low back. She reports that her pain is normally very low. However, specific activities cause pain to increase dramatically. The pain is aggravated by activities which require standing with either rotation or a forward bend. She can only stand at her sink doing dishes x about 10 minutes before her back pain exacerbates. She also tends to have lots of muscle cramping, which she had during assessment with MMT and even when moving into certain positions. The pain and spasm impede in her ability to perform her normal daily activities and sometimes impede her at work as well. She will benefit from PT to address her postural instability and weakness in trunk and LE to all improved quality of life with decreased pain performing her normal daily activities.  OBJECTIVE IMPAIRMENTS: decreased balance, decreased coordination, decreased endurance, difficulty walking, decreased ROM, decreased strength, improper body mechanics, postural dysfunction, and pain.   ACTIVITY LIMITATIONS: carrying, lifting, bending, standing, squatting, stairs, transfers, and locomotion level  PARTICIPATION LIMITATIONS: cleaning, laundry, shopping, and community activity  PERSONAL FACTORS: Past/current experiences are also affecting patient's functional outcome.   REHAB POTENTIAL: Good  CLINICAL DECISION MAKING: Stable/uncomplicated  EVALUATION COMPLEXITY: Moderate   GOALS: Goals reviewed with patient? Yes  SHORT TERM GOALS: Target date: 02/01/23 I with initial HEP Baseline: Goal status: INITIAL  LONG TERM GOALS: Target date: 03/28/23  I with final HEP Baseline:  Goal  status: INITIAL  2.  Decrease Owestry score to < 21% Baseline: 35.5% Goal status: INITIAL  3.  Patient will be able to stand at her sink and wash dishes x at least 20 minutes with pain < 4/10 Baseline: 10 min, 6/10 Goal status: INITIAL  4.  Patient will verbalize and/or return demonstrate proper body mechanics while performing house work such as mopping or sleeping, to minimize pain. Baseline:  Goal status: INITIAL  5.  Patient will report decrease in her muscle spasms by 75% Baseline: at least 8/day Goal status: INITIAL PLAN:  PT FREQUENCY: 1x/week  PT DURATION: 10 weeks  PLANNED INTERVENTIONS:  Therapeutic exercises, Therapeutic activity, Neuromuscular re-education, Balance training, Gait training, Patient/Family education, Self Care, Joint mobilization, Dry Needling, and Manual therapy  PLAN FOR NEXT SESSION: Initiate HEP for trunk strength and stability, stretching. Overstretched HS? **No mech traction, ionto e stim, vaso, HP/CP  Marcelina Morel, DPT 01/17/2023, 7:10 PM

## 2023-01-25 DIAGNOSIS — J069 Acute upper respiratory infection, unspecified: Secondary | ICD-10-CM | POA: Diagnosis not present

## 2023-01-27 ENCOUNTER — Ambulatory Visit: Payer: Self-pay

## 2023-01-27 DIAGNOSIS — Z720 Tobacco use: Secondary | ICD-10-CM | POA: Diagnosis not present

## 2023-01-27 DIAGNOSIS — J4520 Mild intermittent asthma, uncomplicated: Secondary | ICD-10-CM | POA: Diagnosis not present

## 2023-01-27 DIAGNOSIS — J069 Acute upper respiratory infection, unspecified: Secondary | ICD-10-CM | POA: Diagnosis not present

## 2023-01-27 DIAGNOSIS — J209 Acute bronchitis, unspecified: Secondary | ICD-10-CM | POA: Diagnosis not present

## 2023-02-07 ENCOUNTER — Ambulatory Visit: Payer: Medicaid Other | Attending: Physician Assistant | Admitting: Physical Therapy

## 2023-02-07 DIAGNOSIS — M6281 Muscle weakness (generalized): Secondary | ICD-10-CM | POA: Insufficient documentation

## 2023-02-07 DIAGNOSIS — K648 Other hemorrhoids: Secondary | ICD-10-CM | POA: Diagnosis not present

## 2023-02-07 DIAGNOSIS — K625 Hemorrhage of anus and rectum: Secondary | ICD-10-CM | POA: Diagnosis not present

## 2023-02-07 DIAGNOSIS — M7918 Myalgia, other site: Secondary | ICD-10-CM | POA: Insufficient documentation

## 2023-02-07 DIAGNOSIS — M5459 Other low back pain: Secondary | ICD-10-CM | POA: Insufficient documentation

## 2023-02-07 DIAGNOSIS — K644 Residual hemorrhoidal skin tags: Secondary | ICD-10-CM | POA: Diagnosis not present

## 2023-02-07 DIAGNOSIS — R293 Abnormal posture: Secondary | ICD-10-CM | POA: Insufficient documentation

## 2023-02-07 DIAGNOSIS — K5900 Constipation, unspecified: Secondary | ICD-10-CM | POA: Diagnosis not present

## 2023-02-14 ENCOUNTER — Ambulatory Visit: Payer: Medicaid Other | Admitting: Physical Therapy

## 2023-02-14 ENCOUNTER — Encounter: Payer: Self-pay | Admitting: Physical Therapy

## 2023-02-14 DIAGNOSIS — M6281 Muscle weakness (generalized): Secondary | ICD-10-CM | POA: Diagnosis not present

## 2023-02-14 DIAGNOSIS — M5459 Other low back pain: Secondary | ICD-10-CM | POA: Diagnosis not present

## 2023-02-14 DIAGNOSIS — M7918 Myalgia, other site: Secondary | ICD-10-CM | POA: Diagnosis not present

## 2023-02-14 DIAGNOSIS — R293 Abnormal posture: Secondary | ICD-10-CM | POA: Diagnosis not present

## 2023-02-14 DIAGNOSIS — E538 Deficiency of other specified B group vitamins: Secondary | ICD-10-CM | POA: Diagnosis not present

## 2023-02-14 NOTE — Therapy (Signed)
OUTPATIENT PHYSICAL THERAPY LOWER EXTREMITY EVALUATION   Patient Name: Cindy Livingston MRN: [id number] DOB:1986-01-21, 37 y.o., female Today's Date: 02/14/2023  END OF SESSION:  PT End of Session - 02/14/23 1758     Visit Number 2    Date for PT Re-Evaluation 03/28/23    PT Start Time [id number]    PT Stop Time [id number]    PT Time Calculation (min) 1423 min    Activity Tolerance Patient tolerated treatment well    Behavior During Therapy [facility] for tasks assessed/performed              Past Medical History:  Diagnosis Date   Anxiety    Appendicitis    appendectomy 03/20/12   IBS (irritable bowel syndrome)    No pertinent past medical history    Past Surgical History:  Procedure Laterality Date   APPENDECTOMY  03/20/2012   CESAREAN SECTION  2004, 2007   x 2    FOOT SURGERY Right    LAPAROSCOPIC APPENDECTOMY  03/20/2012   Procedure: APPENDECTOMY LAPAROSCOPIC;  Surgeon: Madilyn Hook, DO;  Location: [place];  Service: General;  Laterality: N/A;  Lap. App.   TUBAL LIGATION     TYMPANOPLASTY Left 1992 and 1995   There are no problems to display for this patient.   PCP: [place] [facility]  REFERRING PROVIDER: Harrison Mons, PA  REFERRING DIAG: M79.18 (ICD-10-CM) - Myalgia, other site   THERAPY DIAG:  Diffuse myofascial pain syndrome  Abnormal posture  Muscle weakness (generalized)  Other low back pain  Rationale for Evaluation and Treatment: Rehabilitation  ONSET DATE: 12/30/22  SUBJECTIVE:   SUBJECTIVE STATEMENT: Patient reports no new issues. Continues to have pain.  PERTINENT HISTORY: Bipolar, BMI 33.0-33.9,adult, HOH L, Depression, Chronic left-sided low back pain with left-sided sciatica, ADD, sacroiliitis  PAIN:  Are you having pain? Yes: NPRS scale: 5-6/10 Pain location: Multiple areas Pain description: cramping, spasming Aggravating factors: Activity-housework, standing Relieving factors: Rest  PRECAUTIONS: None  WEIGHT BEARING RESTRICTIONS: No  FALLS:   Has patient fallen in last 6 months? 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 She spends some time at her desk, but also has to walk and grab parts at times.  PLOF: Independent  PATIENT GOALS: Improve pain, learn to stretch to loosen up.  NEXT MD VISIT: 4 weeks  OBJECTIVE:   DIAGNOSTIC FINDINGS: N/A  COGNITION: Overall cognitive status: Within functional limits for tasks assessed     SENSATION: WFL  MUSCLE LENGTH: Hamstrings: Right 45 deg; Left 40 deg Thomas test: B tight hip flexors  POSTURE: increased lumbar lordosis and anterior pelvic tilt  PALPATION: Tightness in B gluts and piriformis, no TTP, TTP L medial scap border  LOWER EXTREMITY ROM: WFL   LOWER EXTREMITY MMT:  MMT Right eval Left eval  Hip flexion 3+ 3+  Hip extension 3+ 3+  Hip abduction 4 4  Hip adduction    Hip internal rotation    Hip external rotation    Knee flexion 5 5  Knee extension 5 5  Ankle dorsiflexion 5 5  Ankle plantarflexion    Ankle inversion    Ankle eversion     (Blank rows = not tested)  BACK SPECIAL TESTS: SLR, slump test-both neg   FUNCTIONAL TESTS:  5 times sit to stand: 12.34 MCTSIB: Condition 1: Avg of  3 trials: 30 sec, Condition 2: Avg of 3 trials: 0 sec, Condition 3: Avg of 3 trials: 30 sec, Condition 4: Avg of 3 trials: 30 sec, and Total Score: 120/120  GAIT: Distance walked: In clinic distances Assistive device utilized: None Level of assistance: Complete Independence Comments: no abnormalities   TODAY'S TREATMENT:                                                                                                                              DATE:  02/14/23 NuStep L4 x 6 minutes Supine stretch-glut stretch, figure 4, LTR Supine strengthening, bridge, PPT,  clamshell against G tband Standing side step against G tband resistance. Standing shoulder ext, rows, ER against G tband, 2 x 10 reps each RE-assessment for insurance update, see goals.  01/17/23  Education  PATIENT EDUCATION:  Education details: POC Person educated: Patient Education method: Explanation Education comprehension: verbalized understanding  HOME EXERCISE PROGRAM:  [id number]  ASSESSMENT:  CLINICAL IMPRESSION: Patient reports continued pain with standing activities. Initiated HEP for stretching and strengthening, emphasized trunk stability to help her better manage the activity. Educated her to appropriate movements with housework to decrease strain to trunk and back. She was able to return demosntration of all exercises with good form and reported no pain after treatment.  OBJECTIVE IMPAIRMENTS: decreased balance, decreased coordination, decreased endurance, difficulty walking, decreased ROM, decreased strength, improper body mechanics, postural dysfunction, and pain.   ACTIVITY LIMITATIONS: carrying, lifting, bending, standing, squatting, stairs, transfers, and locomotion level  PARTICIPATION LIMITATIONS: cleaning, laundry, shopping, and community activity  PERSONAL FACTORS: Past/current experiences are also affecting patient's functional outcome.   REHAB POTENTIAL: Good  CLINICAL DECISION MAKING: Stable/uncomplicated  EVALUATION COMPLEXITY: Moderate   GOALS: Goals reviewed with patient? Yes  SHORT TERM GOALS: Target date: 02/01/23 I with initial HEP Baseline: Goal status: 02/14/23- HEP provided, ongpoing  LONG TERM GOALS: Target date: 03/28/23  I with final HEP Baseline:  Goal status: INITIAL  2.  Decrease Owestry score to < 21% Baseline: 35.5% Goal status: 02/14/23- 28%, ongoing  3.  Patient will be able to stand at her sink and wash dishes x at least 20 minutes with pain < 4/10 Baseline: 10 min, 6/10 Goal status: 10-15 minutes, 8/10, 02/14/23  4.   Patient will verbalize and/or return demonstrate proper body mechanics while performing house work such as mopping or sleeping, to minimize pain. Baseline:  Goal status: 02/14/23- educated to proper form. Will try to follow at home-ongoing  5.  Patient will report decrease in her muscle spasms by 75% Baseline: at least 8/day Goal status: 02/14/23- ongoing PLAN:  PT FREQUENCY: 1x/week  PT DURATION: 10 weeks  PLANNED INTERVENTIONS: Therapeutic exercises, Therapeutic activity, Neuromuscular re-education, Balance training, Gait training, Patient/Family education, Self Care, Joint mobilization, Dry Needling, and Manual therapy  PLAN FOR NEXT SESSION: Initiate HEP for trunk strength and stability, stretching. Overstretched HS? **No mech traction, ionto e stim, vaso, HP/CP  Beverly Gust  Jolly Mango, DPT 02/14/2023, 6:49 PM

## 2023-02-21 ENCOUNTER — Ambulatory Visit: Payer: Medicaid Other | Admitting: Physical Therapy

## 2023-02-22 DIAGNOSIS — F902 Attention-deficit hyperactivity disorder, combined type: Secondary | ICD-10-CM | POA: Diagnosis not present

## 2023-02-22 DIAGNOSIS — F331 Major depressive disorder, recurrent, moderate: Secondary | ICD-10-CM | POA: Diagnosis not present

## 2023-02-22 DIAGNOSIS — F411 Generalized anxiety disorder: Secondary | ICD-10-CM | POA: Diagnosis not present

## 2023-02-23 DIAGNOSIS — D235 Other benign neoplasm of skin of trunk: Secondary | ICD-10-CM | POA: Diagnosis not present

## 2023-02-23 DIAGNOSIS — L719 Rosacea, unspecified: Secondary | ICD-10-CM | POA: Diagnosis not present

## 2023-02-23 DIAGNOSIS — L720 Epidermal cyst: Secondary | ICD-10-CM | POA: Diagnosis not present

## 2023-03-07 DIAGNOSIS — Z6833 Body mass index (BMI) 33.0-33.9, adult: Secondary | ICD-10-CM | POA: Diagnosis not present

## 2023-03-07 DIAGNOSIS — R634 Abnormal weight loss: Secondary | ICD-10-CM | POA: Diagnosis not present

## 2023-03-07 DIAGNOSIS — E668 Other obesity: Secondary | ICD-10-CM | POA: Diagnosis not present

## 2023-03-14 DIAGNOSIS — Z9889 Other specified postprocedural states: Secondary | ICD-10-CM | POA: Diagnosis not present

## 2023-03-14 DIAGNOSIS — H9202 Otalgia, left ear: Secondary | ICD-10-CM | POA: Diagnosis not present

## 2023-03-21 DIAGNOSIS — E538 Deficiency of other specified B group vitamins: Secondary | ICD-10-CM | POA: Diagnosis not present

## 2023-03-25 DIAGNOSIS — E785 Hyperlipidemia, unspecified: Secondary | ICD-10-CM | POA: Diagnosis not present

## 2023-03-25 DIAGNOSIS — F1090 Alcohol use, unspecified, uncomplicated: Secondary | ICD-10-CM | POA: Diagnosis not present

## 2023-03-25 DIAGNOSIS — F17210 Nicotine dependence, cigarettes, uncomplicated: Secondary | ICD-10-CM | POA: Diagnosis not present

## 2023-03-25 DIAGNOSIS — L989 Disorder of the skin and subcutaneous tissue, unspecified: Secondary | ICD-10-CM | POA: Diagnosis not present

## 2023-03-25 DIAGNOSIS — M7989 Other specified soft tissue disorders: Secondary | ICD-10-CM | POA: Diagnosis not present

## 2023-03-25 DIAGNOSIS — M79662 Pain in left lower leg: Secondary | ICD-10-CM | POA: Diagnosis not present

## 2023-03-28 DIAGNOSIS — K625 Hemorrhage of anus and rectum: Secondary | ICD-10-CM | POA: Diagnosis not present

## 2023-03-28 DIAGNOSIS — K5900 Constipation, unspecified: Secondary | ICD-10-CM | POA: Diagnosis not present

## 2023-03-31 ENCOUNTER — Ambulatory Visit (INDEPENDENT_AMBULATORY_CARE_PROVIDER_SITE_OTHER): Payer: Medicaid Other

## 2023-03-31 ENCOUNTER — Ambulatory Visit: Payer: Medicaid Other | Attending: Internal Medicine | Admitting: Internal Medicine

## 2023-03-31 ENCOUNTER — Encounter: Payer: Self-pay | Admitting: Internal Medicine

## 2023-03-31 VITALS — BP 104/82 | HR 96 | Ht 68.0 in | Wt 220.8 lb

## 2023-03-31 DIAGNOSIS — R002 Palpitations: Secondary | ICD-10-CM

## 2023-03-31 NOTE — Progress Notes (Unsigned)
Enrolled patient for a 7 day Zio XT monitor to be mailed to patients home.  

## 2023-03-31 NOTE — Progress Notes (Signed)
Cardiology Office Note:    Date:  03/31/2023   ID:  Cindy Livingston, DOB [birth year], MRN [id number]  PCP:  [facility]   [facility] Providers Cardiologist:  None     Referring MD: [facility]   No chief complaint on file. Palpitations  History of Present Illness:    Cindy Livingston is a 37 y.o. female with a hx of IBS, referral for palpitations. She notes her skips and races. Noticed some L leg. No syncope. She has vertigo with dizzy spells. She is participating in [employer]. She smokes. She drinks caffeine ( coffee, soda)  Past Medical History:  Diagnosis Date   Anxiety    Appendicitis    appendectomy 03/20/12   IBS (irritable bowel syndrome)    No pertinent past medical history     Past Surgical History:  Procedure Laterality Date   APPENDECTOMY  03/20/2012   CESAREAN SECTION  2004, 2007   x 2    FOOT SURGERY Right    LAPAROSCOPIC APPENDECTOMY  03/20/2012   Procedure: APPENDECTOMY LAPAROSCOPIC;  Surgeon: [name], DO;  [facility];  Service: General;  Laterality: N/A;  Lap. App.   TUBAL LIGATION     TYMPANOPLASTY Left 1992 and 1995    Current Medications: Current Meds  Medication Sig   amphetamine-dextroamphetamine (ADDERALL XR) 15 MG 24 hr capsule Take 15 mg by mouth every morning.   amphetamine-dextroamphetamine (ADDERALL) 10 MG tablet Take 10 mg by mouth daily.   ciprofloxacin-dexamethasone (CIPRODEX) OTIC suspension Place 4 drops into the left ear as needed.   cyanocobalamin (VITAMIN B12) 1000 MCG/ML injection Inject 1,000 mcg into the muscle every 30 (thirty) days.   famotidine (PEPCID) 20 MG tablet Take 1 tablet (20 mg total) by mouth 2 (two) times daily.   ibuprofen (ADVIL) 600 MG tablet Take 1 tablet (600 mg total) by mouth every 8 (eight) hours as needed.   montelukast (SINGULAIR) 10 MG tablet Take 10 mg by mouth at bedtime.   traZODone (DESYREL) 50 MG tablet    Vilazodone HCl (VIIBRYD) 20 MG  TABS Take 1 tablet by mouth daily in the afternoon.     Allergies:   Vicodin [hydrocodone-acetaminophen], Codeine, and Wellbutrin [bupropion]   Social History   Socioeconomic History   Marital status: Married    Spouse name: Not on file   Number of children: Not on file   Years of education: Not on file   Highest education level: Not on file  Occupational History   Not on file  Tobacco Use   Smoking status: Every Day    Packs/day: 1    Types: Cigarettes   Smokeless tobacco: Never  Vaping Use   Vaping Use: Never used  Substance and Sexual Activity   Alcohol use: Yes    Comment: occ   Drug use: Yes    Types: Marijuana    Comment: no longer do marijuana - last use was 6months ago   Sexual activity: Yes    Birth control/protection: None  Other Topics Concern   Not on file  Social History Narrative   Not on file   Social Determinants of Health   Financial Resource Strain: Not on file  Food Insecurity: Not on file  Transportation Needs: Not on file  Physical Activity: Not on file  Stress: Not on file  Social Connections: Not on file     Family History: The patient's family history includes Alcohol abuse in her mother; Cancer in  her maternal grandmother; Depression in her maternal grandmother and mother; Drug abuse in her mother; Hyperlipidemia in her maternal grandmother. Father had a CVA in his 49s, MI  ROS:   Please see the history of present illness.     All other systems reviewed and are negative.  EKGs/Labs/Other Studies Reviewed:    The following studies were reviewed today:   EKG:  EKG is  ordered today.  The ekg ordered today demonstrates   03/31/2023: NSR  Recent Labs: No results found for requested labs within last 365 days.   Recent Lipid Panel No results found for: "CHOL", "TRIG", "HDL", "CHOLHDL", "VLDL", "LDLCALC", "LDLDIRECT"   Risk Assessment/Calculations:         Physical Exam:    VS:  BP 104/82 (BP Location: Left Arm, Patient  Position: Sitting, Cuff Size: Normal)   Pulse 96   Ht 5\' 8"  (1.727 m)   Wt 220 lb 12.8 oz (100.2 kg)   SpO2 97%   BMI 33.57 kg/m     Wt Readings from Last 3 Encounters:  03/31/23 220 lb 12.8 oz (100.2 kg)  01/30/18 185 lb (83.9 kg)  01/21/18 183 lb (83 kg)     GEN:  Well nourished, well developed in no acute distress HEENT: Normal NECK: No JVD; No carotid bruits LYMPHATICS: No lymphadenopathy CARDIAC: RRR, no murmurs, rubs, gallops RESPIRATORY:  Clear to auscultation without rales, wheezing or rhonchi  ABDOMEN: Soft, non-tender, non-distended MUSCULOSKELETAL:  No edema; No deformity  SKIN: Warm and dry NEUROLOGIC:  Alert and oriented x 3 PSYCHIATRIC:  Normal affect   ASSESSMENT:    Palpitations: He does not have high risk features including syncope c/f arrhythmia , family hx of SCD, or abnormalities on her EKGs.  Recommend smoking cessation and caffeine. Hydrate electrolytes  PLAN:    In order of problems listed above:  7 day ziopatch Follow up PRN      Medication Adjustments/Labs and Tests Ordered: Current medicines are reviewed at length with the patient today.  Concerns regarding medicines are outlined above.  Orders Placed This Encounter  Procedures   LONG TERM MONITOR (3-14 DAYS)   EKG 12-Lead   No orders of the defined types were placed in this encounter.   Patient Instructions  Medication Instructions:  NO CHANGES  *If you need a refill on your cardiac medications before your next appointment, please call your pharmacy*   Testing/Procedures: ZIO XT- Long Term Monitor Instructions  Your physician has requested you wear a ZIO patch monitor for 7 days.  This is a single patch monitor. [employer] supplies one patch monitor per enrollment. Additional stickers are not available. Please do not apply patch if you will be having a Nuclear Stress Test,  Echocardiogram, Cardiac CT, MRI, or Chest Xray during the period you would be wearing the  monitor. The  patch cannot be worn during these tests. You cannot remove and re-apply the  ZIO XT patch monitor.  Your ZIO patch monitor will be mailed 3 day USPS to your address on file. It may take 3-5 days  to receive your monitor after you have been enrolled.  Once you have received your monitor, please review the enclosed instructions. Your monitor  has already been registered assigning a specific monitor serial # to you.  Billing and Patient Assistance Program Information  We have supplied [employer] with any of your insurance information on file for billing purposes. [employer] offers a sliding scale Patient Assistance Program for patients that do not have  insurance, or whose insurance does not completely cover the cost of the ZIO monitor.  You must apply for the Patient Assistance Program to qualify for this discounted rate.  To apply, please call [employer] at [phone], select option 4, select option 2, ask to apply for  Patient Assistance Program. Meredeth Ide will ask your household income, and how many people  are in your household. They will quote your out-of-pocket cost based on that information.  [employer] will also be able to set up a 66-month, interest-free payment plan if needed.  Applying the monitor   Shave hair from upper left chest.  Hold abrader disc by orange tab. Rub abrader in 40 strokes over the upper left chest as  indicated in your monitor instructions.  Clean area with 4 enclosed alcohol pads. Let dry.  Apply patch as indicated in monitor instructions. Patch will be placed under collarbone on left  side of chest with arrow pointing upward.  Rub patch adhesive wings for 2 minutes. Remove white label marked "1". Remove the white  label marked "2". Rub patch adhesive wings for 2 additional minutes.  While looking in a mirror, press and release button in center of patch. A small green light will  flash 3-4 times. This will be your only indicator that the monitor has been turned on.  Do  not shower for the first 24 hours. You may shower after the first 24 hours.  Press the button if you feel a symptom. You will hear a small click. Record Date, Time and  Symptom in the Patient Logbook.  When you are ready to remove the patch, follow instructions on the last 2 pages of Patient  Logbook. Stick patch monitor onto the last page of Patient Logbook.  Place Patient Logbook in the blue and white box. Use locking tab on box and tape box closed  securely. The blue and white box has prepaid postage on it. Please place it in the mailbox as  soon as possible. Your physician should have your test results approximately 7 days after the  monitor has been mailed back to [facility].  Call [facility] Customer Care at [phone] if you have questions regarding  your ZIO XT patch monitor. Call them immediately if you see an orange light blinking on your  monitor.  If your monitor falls off in less than 4 days, contact our Monitor department at [phone].  If your monitor becomes loose or falls off after 4 days call [employer] at [phone] for  suggestions on securing your monitor    Follow-Up: At [facility], you and your health needs are our priority.  As part of our continuing mission to provide you with exceptional heart care, we have created designated Provider Care Teams.  These Care Teams include your primary Cardiologist (physician) and Advanced Practice Providers (APPs -  Physician Assistants and Nurse Practitioners) who all work together to provide you with the care you need, when you need it.  We recommend signing up for the patient portal called "MyChart".  Sign up information is provided on this After Visit Summary.  MyChart is used to connect with patients for Virtual Visits (Telemedicine).  Patients are able to view lab/test results, encounter notes, upcoming appointments, etc.  Non-urgent messages can be sent to your provider as well.   To learn more about  what you can do with MyChart, go to [email or web address].    Your next appointment:    AS NEEDED with Dr. Wyline Mood    Signed,  Maisie Fus, MD  03/31/2023 8:24 AM    [facility]

## 2023-03-31 NOTE — Patient Instructions (Addendum)
Medication Instructions:  NO CHANGES  *If you need a refill on your cardiac medications before your next appointment, please call your pharmacy*   Testing/Procedures: ZIO XT- Long Term Monitor Instructions  Your physician has requested you wear a ZIO patch monitor for 7 days.  This is a single patch monitor. [employer] supplies one patch monitor per enrollment. Additional stickers are not available. Please do not apply patch if you will be having a Nuclear Stress Test,  Echocardiogram, Cardiac CT, MRI, or Chest Xray during the period you would be wearing the  monitor. The patch cannot be worn during these tests. You cannot remove and re-apply the  ZIO XT patch monitor.  Your ZIO patch monitor will be mailed 3 day USPS to your address on file. It may take 3-5 days  to receive your monitor after you have been enrolled.  Once you have received your monitor, please review the enclosed instructions. Your monitor  has already been registered assigning a specific monitor serial # to you.  Billing and Patient Assistance Program Information  We have supplied [employer] with any of your insurance information on file for billing purposes. [employer] offers a sliding scale Patient Assistance Program for patients that do not have  insurance, or whose insurance does not completely cover the cost of the ZIO monitor.  You must apply for the Patient Assistance Program to qualify for this discounted rate.  To apply, please call [employer] at [phone], select option 4, select option 2, ask to apply for  Patient Assistance Program. Meredeth Ide will ask your household income, and how many people  are in your household. They will quote your out-of-pocket cost based on that information.  [employer] will also be able to set up a 6-month, interest-free payment plan if needed.  Applying the monitor   Shave hair from upper left chest.  Hold abrader disc by orange tab. Rub abrader in 40 strokes over the upper left chest as   indicated in your monitor instructions.  Clean area with 4 enclosed alcohol pads. Let dry.  Apply patch as indicated in monitor instructions. Patch will be placed under collarbone on left  side of chest with arrow pointing upward.  Rub patch adhesive wings for 2 minutes. Remove white label marked "1". Remove the white  label marked "2". Rub patch adhesive wings for 2 additional minutes.  While looking in a mirror, press and release button in center of patch. A small green light will  flash 3-4 times. This will be your only indicator that the monitor has been turned on.  Do not shower for the first 24 hours. You may shower after the first 24 hours.  Press the button if you feel a symptom. You will hear a small click. Record Date, Time and  Symptom in the Patient Logbook.  When you are ready to remove the patch, follow instructions on the last 2 pages of Patient  Logbook. Stick patch monitor onto the last page of Patient Logbook.  Place Patient Logbook in the blue and white box. Use locking tab on box and tape box closed  securely. The blue and white box has prepaid postage on it. Please place it in the mailbox as  soon as possible. Your physician should have your test results approximately 7 days after the  monitor has been mailed back to [facility].  Call [facility] Customer Care at [phone] if you have questions regarding  your ZIO XT patch monitor. Call them immediately if you see an orange  light blinking on your  monitor.  If your monitor falls off in less than 4 days, contact our Monitor department at [phone].  If your monitor becomes loose or falls off after 4 days call [employer] at [phone] for  suggestions on securing your monitor    Follow-Up: At [facility], you and your health needs are our priority.  As part of our continuing mission to provide you with exceptional heart care, we have created designated Provider Care Teams.  These Care Teams  include your primary Cardiologist (physician) and Advanced Practice Providers (APPs -  Physician Assistants and Nurse Practitioners) who all work together to provide you with the care you need, when you need it.  We recommend signing up for the patient portal called "MyChart".  Sign up information is provided on this After Visit Summary.  MyChart is used to connect with patients for Virtual Visits (Telemedicine).  Patients are able to view lab/test results, encounter notes, upcoming appointments, etc.  Non-urgent messages can be sent to your provider as well.   To learn more about what you can do with MyChart, go to [email or web address].    Your next appointment:    AS NEEDED with Dr. Wyline Mood

## 2023-04-04 DIAGNOSIS — R002 Palpitations: Secondary | ICD-10-CM | POA: Diagnosis not present

## 2023-04-08 DIAGNOSIS — M7989 Other specified soft tissue disorders: Secondary | ICD-10-CM | POA: Diagnosis not present

## 2023-04-08 DIAGNOSIS — M79662 Pain in left lower leg: Secondary | ICD-10-CM | POA: Diagnosis not present

## 2023-04-25 DIAGNOSIS — H01024 Squamous blepharitis left upper eyelid: Secondary | ICD-10-CM | POA: Diagnosis not present

## 2023-05-23 DIAGNOSIS — F331 Major depressive disorder, recurrent, moderate: Secondary | ICD-10-CM | POA: Diagnosis not present

## 2023-05-23 DIAGNOSIS — F902 Attention-deficit hyperactivity disorder, combined type: Secondary | ICD-10-CM | POA: Diagnosis not present

## 2023-05-23 DIAGNOSIS — F411 Generalized anxiety disorder: Secondary | ICD-10-CM | POA: Diagnosis not present

## 2023-06-24 DIAGNOSIS — E538 Deficiency of other specified B group vitamins: Secondary | ICD-10-CM | POA: Diagnosis not present

## 2023-06-27 DIAGNOSIS — H9202 Otalgia, left ear: Secondary | ICD-10-CM | POA: Diagnosis not present

## 2023-07-18 DIAGNOSIS — F3289 Other specified depressive episodes: Secondary | ICD-10-CM | POA: Diagnosis not present

## 2023-07-26 DIAGNOSIS — E538 Deficiency of other specified B group vitamins: Secondary | ICD-10-CM | POA: Diagnosis not present

## 2023-08-10 DIAGNOSIS — F3289 Other specified depressive episodes: Secondary | ICD-10-CM | POA: Diagnosis not present

## 2023-08-25 DIAGNOSIS — E538 Deficiency of other specified B group vitamins: Secondary | ICD-10-CM | POA: Diagnosis not present

## 2023-09-14 DIAGNOSIS — F3289 Other specified depressive episodes: Secondary | ICD-10-CM | POA: Diagnosis not present

## 2023-09-20 DIAGNOSIS — F331 Major depressive disorder, recurrent, moderate: Secondary | ICD-10-CM | POA: Diagnosis not present

## 2023-09-20 DIAGNOSIS — F902 Attention-deficit hyperactivity disorder, combined type: Secondary | ICD-10-CM | POA: Diagnosis not present

## 2023-09-20 DIAGNOSIS — F411 Generalized anxiety disorder: Secondary | ICD-10-CM | POA: Diagnosis not present

## 2023-09-30 DIAGNOSIS — J4520 Mild intermittent asthma, uncomplicated: Secondary | ICD-10-CM | POA: Diagnosis not present

## 2023-09-30 DIAGNOSIS — E538 Deficiency of other specified B group vitamins: Secondary | ICD-10-CM | POA: Diagnosis not present

## 2023-09-30 DIAGNOSIS — R8290 Unspecified abnormal findings in urine: Secondary | ICD-10-CM | POA: Diagnosis not present

## 2023-09-30 DIAGNOSIS — M79602 Pain in left arm: Secondary | ICD-10-CM | POA: Diagnosis not present

## 2023-09-30 DIAGNOSIS — M533 Sacrococcygeal disorders, not elsewhere classified: Secondary | ICD-10-CM | POA: Diagnosis not present

## 2023-09-30 DIAGNOSIS — K219 Gastro-esophageal reflux disease without esophagitis: Secondary | ICD-10-CM | POA: Diagnosis not present

## 2023-09-30 DIAGNOSIS — R350 Frequency of micturition: Secondary | ICD-10-CM | POA: Diagnosis not present

## 2023-09-30 DIAGNOSIS — F3174 Bipolar disorder, in full remission, most recent episode manic: Secondary | ICD-10-CM | POA: Diagnosis not present

## 2023-09-30 DIAGNOSIS — M549 Dorsalgia, unspecified: Secondary | ICD-10-CM | POA: Diagnosis not present

## 2023-09-30 DIAGNOSIS — E785 Hyperlipidemia, unspecified: Secondary | ICD-10-CM | POA: Diagnosis not present

## 2023-09-30 DIAGNOSIS — R635 Abnormal weight gain: Secondary | ICD-10-CM | POA: Diagnosis not present

## 2023-09-30 DIAGNOSIS — R0789 Other chest pain: Secondary | ICD-10-CM | POA: Diagnosis not present

## 2023-10-05 DIAGNOSIS — F3289 Other specified depressive episodes: Secondary | ICD-10-CM | POA: Diagnosis not present

## 2023-10-25 DIAGNOSIS — F411 Generalized anxiety disorder: Secondary | ICD-10-CM | POA: Diagnosis not present

## 2023-10-25 DIAGNOSIS — F331 Major depressive disorder, recurrent, moderate: Secondary | ICD-10-CM | POA: Diagnosis not present

## 2023-10-25 DIAGNOSIS — F902 Attention-deficit hyperactivity disorder, combined type: Secondary | ICD-10-CM | POA: Diagnosis not present

## 2023-10-31 DIAGNOSIS — E538 Deficiency of other specified B group vitamins: Secondary | ICD-10-CM | POA: Diagnosis not present

## 2023-11-09 DIAGNOSIS — F3289 Other specified depressive episodes: Secondary | ICD-10-CM | POA: Diagnosis not present

## 2023-11-28 DIAGNOSIS — R051 Acute cough: Secondary | ICD-10-CM | POA: Diagnosis not present

## 2023-11-28 DIAGNOSIS — J0110 Acute frontal sinusitis, unspecified: Secondary | ICD-10-CM | POA: Diagnosis not present

## 2023-11-29 ENCOUNTER — Other Ambulatory Visit: Payer: Self-pay

## 2023-11-29 ENCOUNTER — Emergency Department (HOSPITAL_COMMUNITY)
Admission: EM | Admit: 2023-11-29 | Discharge: 2023-11-30 | Discharge status: Against Medical Advice | Payer: Managed Care, Other (non HMO) | Attending: Emergency Medicine | Admitting: Emergency Medicine

## 2023-11-29 ENCOUNTER — Encounter (HOSPITAL_COMMUNITY): Payer: Self-pay

## 2023-11-29 ENCOUNTER — Emergency Department (HOSPITAL_COMMUNITY): Payer: Managed Care, Other (non HMO)

## 2023-11-29 DIAGNOSIS — R42 Dizziness and giddiness: Secondary | ICD-10-CM | POA: Diagnosis not present

## 2023-11-29 DIAGNOSIS — R110 Nausea: Secondary | ICD-10-CM | POA: Diagnosis not present

## 2023-11-29 DIAGNOSIS — R079 Chest pain, unspecified: Secondary | ICD-10-CM | POA: Insufficient documentation

## 2023-11-29 DIAGNOSIS — R059 Cough, unspecified: Secondary | ICD-10-CM | POA: Diagnosis not present

## 2023-11-29 DIAGNOSIS — Z5321 Procedure and treatment not carried out due to patient leaving prior to being seen by health care provider: Secondary | ICD-10-CM | POA: Diagnosis not present

## 2023-11-29 DIAGNOSIS — R39198 Other difficulties with micturition: Secondary | ICD-10-CM | POA: Insufficient documentation

## 2023-11-29 DIAGNOSIS — R197 Diarrhea, unspecified: Secondary | ICD-10-CM | POA: Insufficient documentation

## 2023-11-29 LAB — CBC
HCT: 42.4 % (ref 36.0–46.0)
Hemoglobin: 13.6 g/dL (ref 12.0–15.0)
MCH: 25.8 pg — ABNORMAL LOW (ref 26.0–34.0)
MCHC: 32.1 g/dL (ref 30.0–36.0)
MCV: 80.3 fL (ref 80.0–100.0)
Platelets: 382 10*3/uL (ref 150–400)
RBC: 5.28 MIL/uL — ABNORMAL HIGH (ref 3.87–5.11)
RDW: 13.7 % (ref 11.5–15.5)
WBC: 12.3 10*3/uL — ABNORMAL HIGH (ref 4.0–10.5)
nRBC: 0 % (ref 0.0–0.2)

## 2023-11-29 LAB — TROPONIN I (HIGH SENSITIVITY)
Troponin I (High Sensitivity): 3 ng/L (ref <18)
Troponin I (High Sensitivity): 3 ng/L (ref <18)

## 2023-11-29 LAB — URINALYSIS, ROUTINE W REFLEX MICROSCOPIC
Bilirubin Urine: NEGATIVE
Glucose, UA: NEGATIVE mg/dL
Hgb urine dipstick: NEGATIVE
Ketones, ur: NEGATIVE mg/dL
Leukocytes,Ua: NEGATIVE
Nitrite: NEGATIVE
Protein, ur: NEGATIVE mg/dL
Specific Gravity, Urine: 1.02 (ref 1.005–1.030)
pH: 6 (ref 5.0–8.0)

## 2023-11-29 LAB — BASIC METABOLIC PANEL
Anion gap: 11 (ref 5–15)
BUN: <5 mg/dL — ABNORMAL LOW (ref 6–20)
CO2: 21 mmol/L — ABNORMAL LOW (ref 22–32)
Calcium: 8.9 mg/dL (ref 8.9–10.3)
Chloride: 105 mmol/L (ref 98–111)
Creatinine, Ser: 0.74 mg/dL (ref 0.44–1.00)
GFR, Estimated: >60 mL/min (ref >60)
Glucose, Bld: 133 mg/dL — ABNORMAL HIGH (ref 70–99)
Potassium: 3.8 mmol/L (ref 3.5–5.1)
Sodium: 137 mmol/L (ref 135–145)

## 2023-11-29 LAB — HCG, SERUM, QUALITATIVE: Preg, Serum: NEGATIVE

## 2023-11-29 NOTE — ED Triage Notes (Addendum)
 Last Tuesday, developed cough, congestion , URI + for Covid and Flu yesterday it was Negative.  Have not been able to sleep in the last 4 days due to coughing.   Developed chest pain today,  denies any sob.   Having nausea and dizziness Started  a Z-pack yesterday and developed diarrhea.  Skin is p/w/d  Chest pain increases with cough.  Pt. Also reports having difficulty urinating, she feels like she cannot empty her bladder.

## 2023-11-29 NOTE — ED Notes (Signed)
 Pt left AMA

## 2023-12-01 DIAGNOSIS — J209 Acute bronchitis, unspecified: Secondary | ICD-10-CM | POA: Diagnosis not present

## 2023-12-01 DIAGNOSIS — J3089 Other allergic rhinitis: Secondary | ICD-10-CM | POA: Diagnosis not present

## 2023-12-01 DIAGNOSIS — J0110 Acute frontal sinusitis, unspecified: Secondary | ICD-10-CM | POA: Diagnosis not present

## 2024-01-04 DIAGNOSIS — F3289 Other specified depressive episodes: Secondary | ICD-10-CM | POA: Diagnosis not present

## 2024-02-01 DIAGNOSIS — F3289 Other specified depressive episodes: Secondary | ICD-10-CM | POA: Diagnosis not present

## 2024-02-02 DIAGNOSIS — E538 Deficiency of other specified B group vitamins: Secondary | ICD-10-CM | POA: Diagnosis not present

## 2024-02-29 DIAGNOSIS — F3289 Other specified depressive episodes: Secondary | ICD-10-CM | POA: Diagnosis not present

## 2024-03-14 DIAGNOSIS — F329 Major depressive disorder, single episode, unspecified: Secondary | ICD-10-CM | POA: Diagnosis not present

## 2024-03-14 DIAGNOSIS — F909 Attention-deficit hyperactivity disorder, unspecified type: Secondary | ICD-10-CM | POA: Diagnosis not present

## 2024-03-14 DIAGNOSIS — F411 Generalized anxiety disorder: Secondary | ICD-10-CM | POA: Diagnosis not present

## 2024-03-14 DIAGNOSIS — G4719 Other hypersomnia: Secondary | ICD-10-CM | POA: Diagnosis not present

## 2024-03-27 ENCOUNTER — Ambulatory Visit (HOSPITAL_COMMUNITY): Admission: EM | Admit: 2024-03-27 | Discharge: 2024-03-27 | Disposition: A | Discharge status: Home / Self Care

## 2024-03-27 ENCOUNTER — Ambulatory Visit: Payer: Self-pay

## 2024-03-27 ENCOUNTER — Encounter (HOSPITAL_COMMUNITY): Payer: Self-pay | Admitting: *Deleted

## 2024-03-27 DIAGNOSIS — R42 Dizziness and giddiness: Secondary | ICD-10-CM | POA: Diagnosis not present

## 2024-03-27 DIAGNOSIS — R519 Headache, unspecified: Secondary | ICD-10-CM | POA: Diagnosis not present

## 2024-03-27 DIAGNOSIS — R55 Syncope and collapse: Secondary | ICD-10-CM | POA: Diagnosis not present

## 2024-03-27 LAB — POCT FASTING CBG KUC MANUAL ENTRY: POCT Glucose (KUC): 89 mg/dL (ref 70–99)

## 2024-03-27 NOTE — ED Triage Notes (Signed)
 Pt states for the last few months on and off she has been getting dizzy, headaches, blurred vision. She has seen her ENT physician as well as her neurologist recently. Her neurologist has ordered a MRI but its not scheduled yet. He did give her Topamax but she hasn't picked up these medications. She has meclizine and gabapentin to use for her sx but she states she hasn't ever taken then she is scared to take meds.

## 2024-03-27 NOTE — ED Provider Notes (Signed)
 [facility] [place]  Note:  This document was prepared using Dragon voice recognition software and may include unintentional dictation errors.  MRN: [id number] DOB: 11-18-1986  Subjective:   Cindy Livingston is a 38 y.o. female presenting for repeat evaluation for ongoing headache, dizziness, intermittent blurred vision x 2 to 3 months.  Patient reports that she has been previously evaluated by ENT as well as neurology for symptoms.  Neurology has prescribed MRI as well as Topamax for chronic headache.  ENT prescribed meclizine for dizziness.  Patient denies taking either these medications because she is unsure of the possible side effects.  Patient states that she has not scheduled the MRI yet since she is still waiting for them to call her for the appointment.  Patient denies any significant change in symptoms.  Patient states that she woke up this morning and had severe posterior headache has not taken any over-the-counter medication to treat symptoms.  Patient is neurologically intact, normal coordination, no blurred vision or vision changes today.  No current facility-administered medications for this encounter.  Current Outpatient Medications:    amphetamine-dextroamphetamine (ADDERALL XR) 15 MG 24 hr capsule, Take 15 mg by mouth every morning., Disp: , Rfl:    cyanocobalamin  (VITAMIN B12) 1000 MCG/ML injection, Inject 1,000 mcg into the muscle every 30 (thirty) days., Disp: , Rfl:    gabapentin (NEURONTIN) 100 MG capsule, Take 100 mg by mouth., Disp: , Rfl:    meclizine (ANTIVERT) 25 MG tablet, Take 25 mg by mouth 3 (three) times daily as needed., Disp: , Rfl:    montelukast (SINGULAIR) 10 MG tablet, Take 10 mg by mouth at bedtime., Disp: , Rfl:    topiramate (TOPAMAX) 25 MG tablet, Take 25 mg by mouth., Disp: , Rfl:    amphetamine-dextroamphetamine (ADDERALL) 10 MG tablet, Take 10 mg by mouth daily., Disp: , Rfl:    brompheniramine-pseudoephedrine-DM 30-2-10 MG/5ML syrup, Take 5 mLs  by mouth 3 (three) times daily as needed. (Patient not taking: Reported on 03/31/2023), Disp: 140 mL, Rfl: 0   ciprofloxacin -dexamethasone  (CIPRODEX) OTIC suspension, Place 4 drops into the left ear as needed., Disp: , Rfl:    Desvenlafaxine ER (PRISTIQ) 50 MG TB24, Take 1 tablet by mouth daily. (Patient not taking: Reported on 03/31/2023), Disp: , Rfl:    famotidine  (PEPCID ) 20 MG tablet, Take 1 tablet (20 mg total) by mouth 2 (two) times daily., Disp: 30 tablet, Rfl: 0   fluticasone  (FLONASE ) 50 MCG/ACT nasal spray, Place 2 sprays daily into both nostrils. (Patient not taking: Reported on 03/31/2023), Disp: 1 g, Rfl: 0   ibuprofen  (ADVIL ) 600 MG tablet, Take 1 tablet (600 mg total) by mouth every 8 (eight) hours as needed., Disp: 21 tablet, Rfl: 0   traZODone (DESYREL) 50 MG tablet, , Disp: , Rfl:    Vilazodone HCl (VIIBRYD) 20 MG TABS, Take 1 tablet by mouth daily in the afternoon., Disp: , Rfl:    Allergies  Allergen Reactions   Azithromycin  Diarrhea and Other (See Comments)    Tachycardia   Prednisone Diarrhea and Other (See Comments)    Tachycardia   Vicodin [Hydrocodone -Acetaminophen ] Nausea And Vomiting   Codeine Rash and Other (See Comments)    headache   Wellbutrin [Bupropion] Rash    Past Medical History:  Diagnosis Date   Anxiety    Appendicitis    appendectomy 03/20/12   IBS (irritable bowel syndrome)    No pertinent past medical history      Past Surgical History:  Procedure Laterality  Date   APPENDECTOMY  03/20/2012   CESAREAN SECTION  2004, 2007   x 2    FOOT SURGERY Right    LAPAROSCOPIC APPENDECTOMY  03/20/2012   Procedure: APPENDECTOMY LAPAROSCOPIC;  Surgeon: Evander Hills, DO;  Location: [facility] OR;  Service: General;  Laterality: N/A;  Lap. App.   TUBAL LIGATION     TYMPANOPLASTY Left 1992 and 1995    Family History  Problem Relation Age of Onset   Depression Mother    Alcohol abuse Mother    Drug abuse Mother    Depression Maternal Grandmother    Hyperlipidemia  Maternal Grandmother    Cancer Maternal Grandmother     Social History   Tobacco Use   Smoking status: Former    Current packs/day: 1.00    Types: Cigarettes   Smokeless tobacco: Never  Vaping Use   Vaping status: Every Day  Substance Use Topics   Alcohol use: Yes    Comment: occ   Drug use: Not Currently    Types: Marijuana    Comment: not in 5 years.. 03/27/2024    ROS Refer to HPI for ROS details.  Objective:   Vitals: BP 121/82 (BP Location: Left Arm)   Pulse 89   Temp 98.4 F (36.9 C) (Oral)   Resp 18   LMP 03/12/2024 (Exact Date)   SpO2 98%   Physical Exam Vitals and nursing note reviewed.  Constitutional:      General: She is not in acute distress.    Appearance: She is well-developed. She is not ill-appearing or toxic-appearing.  HENT:     Head: Normocephalic and atraumatic.     Nose: Nose normal.     Mouth/Throat:     Mouth: Mucous membranes are moist.  Eyes:     General:        Right eye: No discharge.        Left eye: No discharge.     Extraocular Movements: Extraocular movements intact.     Conjunctiva/sclera: Conjunctivae normal.     Pupils: Pupils are equal, round, and reactive to light.  Cardiovascular:     Rate and Rhythm: Normal rate.  Pulmonary:     Effort: Pulmonary effort is normal. No respiratory distress.  Skin:    General: Skin is warm and dry.  Neurological:     General: No focal deficit present.     Mental Status: She is alert and oriented to person, place, and time.     Cranial Nerves: No cranial nerve deficit.     Sensory: No sensory deficit.     Motor: No weakness.     Coordination: Coordination normal.     Gait: Gait normal.  Psychiatric:        Mood and Affect: Mood normal.        Behavior: Behavior normal.        Thought Content: Thought content normal.        Judgment: Judgment normal.     Procedures  Results for orders placed or performed during the hospital encounter of 03/27/24 (from the past 24 hours)  POC  CBG monitoring     Status: Normal   Collection Time: 03/27/24 12:52 PM  Result Value Ref Range   POCT Glucose ([facility]) 89 70 - 99 mg/dL    No results found.   Assessment and Plan :     Discharge Instructions       1. Intractable episodic headache, unspecified headache type (Primary) 2. Near syncope 3. Dizziness -  POC CBG monitoring performed in UC shows 89 mg/dL, within normal limits. - Call MRI imaging to schedule brain MRI as soon as possible - Take prescribed medication from neurology for headache and dizziness symptoms. - Follow-up with neurology for further evaluation and ongoing management of symptoms. -Continue to monitor symptoms for any change in severity if there is any escalation of current symptoms or development of new symptoms follow-up in ER for further evaluation and management.      Shasta Chinn B Kaelin Holford   Hildegarde Dunaway, [place], [place] 03/27/24 1303

## 2024-03-27 NOTE — Discharge Instructions (Addendum)
  1. Intractable episodic headache, unspecified headache type (Primary) 2. Near syncope 3. Dizziness - POC CBG monitoring performed in UC shows 89 mg/dL, within normal limits. - Call MRI imaging to schedule brain MRI as soon as possible - Take prescribed medication from neurology for headache and dizziness symptoms. - Follow-up with neurology for further evaluation and ongoing management of symptoms. -Continue to monitor symptoms for any change in severity if there is any escalation of current symptoms or development of new symptoms follow-up in ER for further evaluation and management.

## 2024-03-29 DIAGNOSIS — G43909 Migraine, unspecified, not intractable, without status migrainosus: Secondary | ICD-10-CM | POA: Diagnosis not present

## 2024-03-29 DIAGNOSIS — J320 Chronic maxillary sinusitis: Secondary | ICD-10-CM | POA: Diagnosis not present

## 2024-03-29 DIAGNOSIS — R9082 White matter disease, unspecified: Secondary | ICD-10-CM | POA: Diagnosis not present

## 2024-04-10 DIAGNOSIS — G4733 Obstructive sleep apnea (adult) (pediatric): Secondary | ICD-10-CM | POA: Diagnosis not present

## 2024-04-11 DIAGNOSIS — E669 Obesity, unspecified: Secondary | ICD-10-CM | POA: Diagnosis not present

## 2024-04-11 DIAGNOSIS — F40240 Claustrophobia: Secondary | ICD-10-CM | POA: Diagnosis not present

## 2024-04-11 DIAGNOSIS — G4733 Obstructive sleep apnea (adult) (pediatric): Secondary | ICD-10-CM | POA: Diagnosis not present

## 2024-04-11 DIAGNOSIS — R0981 Nasal congestion: Secondary | ICD-10-CM | POA: Diagnosis not present

## 2024-04-11 DIAGNOSIS — R519 Headache, unspecified: Secondary | ICD-10-CM | POA: Diagnosis not present

## 2024-04-11 DIAGNOSIS — F329 Major depressive disorder, single episode, unspecified: Secondary | ICD-10-CM | POA: Diagnosis not present

## 2024-05-01 DIAGNOSIS — G4733 Obstructive sleep apnea (adult) (pediatric): Secondary | ICD-10-CM | POA: Diagnosis not present

## 2024-05-16 DIAGNOSIS — N3000 Acute cystitis without hematuria: Secondary | ICD-10-CM | POA: Diagnosis not present

## 2024-06-11 DIAGNOSIS — F902 Attention-deficit hyperactivity disorder, combined type: Secondary | ICD-10-CM | POA: Diagnosis not present

## 2024-06-11 DIAGNOSIS — F331 Major depressive disorder, recurrent, moderate: Secondary | ICD-10-CM | POA: Diagnosis not present

## 2024-06-11 DIAGNOSIS — F411 Generalized anxiety disorder: Secondary | ICD-10-CM | POA: Diagnosis not present

## 2024-07-05 DIAGNOSIS — E538 Deficiency of other specified B group vitamins: Secondary | ICD-10-CM | POA: Diagnosis not present

## 2024-07-10 DIAGNOSIS — J343 Hypertrophy of nasal turbinates: Secondary | ICD-10-CM | POA: Diagnosis not present

## 2024-07-10 DIAGNOSIS — J328 Other chronic sinusitis: Secondary | ICD-10-CM | POA: Diagnosis not present

## 2024-07-10 DIAGNOSIS — J342 Deviated nasal septum: Secondary | ICD-10-CM | POA: Diagnosis not present

## 2024-07-10 DIAGNOSIS — J301 Allergic rhinitis due to pollen: Secondary | ICD-10-CM | POA: Diagnosis not present

## 2024-07-10 DIAGNOSIS — R0989 Other specified symptoms and signs involving the circulatory and respiratory systems: Secondary | ICD-10-CM | POA: Diagnosis not present

## 2024-07-10 DIAGNOSIS — J3489 Other specified disorders of nose and nasal sinuses: Secondary | ICD-10-CM | POA: Diagnosis not present

## 2024-07-13 ENCOUNTER — Other Ambulatory Visit: Payer: Self-pay | Admitting: Sports Medicine

## 2024-07-13 DIAGNOSIS — M533 Sacrococcygeal disorders, not elsewhere classified: Secondary | ICD-10-CM

## 2024-07-18 ENCOUNTER — Ambulatory Visit
Admission: RE | Admit: 2024-07-18 | Discharge: 2024-07-18 | Disposition: A | Discharge status: Home / Self Care | Source: Ambulatory Visit | Attending: Sports Medicine | Admitting: Sports Medicine

## 2024-07-18 DIAGNOSIS — M533 Sacrococcygeal disorders, not elsewhere classified: Secondary | ICD-10-CM

## 2024-07-27 DIAGNOSIS — R300 Dysuria: Secondary | ICD-10-CM | POA: Diagnosis not present

## 2024-08-24 DIAGNOSIS — J342 Deviated nasal septum: Secondary | ICD-10-CM | POA: Diagnosis not present

## 2024-08-24 DIAGNOSIS — J0181 Other acute recurrent sinusitis: Secondary | ICD-10-CM | POA: Diagnosis not present

## 2024-08-24 DIAGNOSIS — J343 Hypertrophy of nasal turbinates: Secondary | ICD-10-CM | POA: Diagnosis not present

## 2024-08-24 DIAGNOSIS — J301 Allergic rhinitis due to pollen: Secondary | ICD-10-CM | POA: Diagnosis not present

## 2024-08-24 DIAGNOSIS — J328 Other chronic sinusitis: Secondary | ICD-10-CM | POA: Diagnosis not present

## 2024-08-24 DIAGNOSIS — J3489 Other specified disorders of nose and nasal sinuses: Secondary | ICD-10-CM | POA: Diagnosis not present

## 2024-10-15 ENCOUNTER — Other Ambulatory Visit: Payer: Self-pay | Admitting: Physician Assistant

## 2024-10-15 DIAGNOSIS — Z1231 Encounter for screening mammogram for malignant neoplasm of breast: Secondary | ICD-10-CM

## 2024-10-17 ENCOUNTER — Other Ambulatory Visit: Payer: Self-pay | Admitting: Physician Assistant

## 2024-10-17 ENCOUNTER — Ambulatory Visit
Admission: RE | Admit: 2024-10-17 | Discharge: 2024-10-17 | Disposition: A | Discharge status: Home / Self Care | Source: Ambulatory Visit | Attending: Physician Assistant | Admitting: Physician Assistant

## 2024-10-17 DIAGNOSIS — N6320 Unspecified lump in the left breast, unspecified quadrant: Secondary | ICD-10-CM

## 2024-10-17 DIAGNOSIS — Z1231 Encounter for screening mammogram for malignant neoplasm of breast: Secondary | ICD-10-CM

## 2024-10-17 DIAGNOSIS — N6489 Other specified disorders of breast: Secondary | ICD-10-CM

## 2024-10-17 DIAGNOSIS — R928 Other abnormal and inconclusive findings on diagnostic imaging of breast: Secondary | ICD-10-CM

## 2024-10-22 ENCOUNTER — Ambulatory Visit
Admission: RE | Admit: 2024-10-22 | Discharge: 2024-10-22 | Disposition: A | Source: Ambulatory Visit | Attending: Physician Assistant | Admitting: Physician Assistant

## 2024-10-22 DIAGNOSIS — N6489 Other specified disorders of breast: Secondary | ICD-10-CM

## 2024-10-22 DIAGNOSIS — R928 Other abnormal and inconclusive findings on diagnostic imaging of breast: Secondary | ICD-10-CM

## 2024-10-22 DIAGNOSIS — N6320 Unspecified lump in the left breast, unspecified quadrant: Secondary | ICD-10-CM

## 2024-10-30 ENCOUNTER — Encounter

## 2024-10-30 ENCOUNTER — Other Ambulatory Visit
